# Patient Record
Sex: Male | Born: 1980 | Hispanic: Yes | Marital: Married | Smoking: Never smoker
Health system: Southern US, Community
[De-identification: ages and names within clinical notes are randomized; demographics above are authoritative.]

## PROBLEM LIST (undated history)

## (undated) DIAGNOSIS — K7460 Unspecified cirrhosis of liver: Secondary | ICD-10-CM

---

## 2011-10-01 ENCOUNTER — Ambulatory Visit: Payer: Self-pay | Admitting: Family Medicine

## 2011-10-01 VITALS — BP 125/76 | HR 83 | Temp 98.1°F | Resp 16 | Ht 65.0 in | Wt 207.8 lb

## 2011-10-01 DIAGNOSIS — B360 Pityriasis versicolor: Secondary | ICD-10-CM

## 2011-10-01 DIAGNOSIS — Z789 Other specified health status: Secondary | ICD-10-CM

## 2011-10-01 MED ORDER — KETOCONAZOLE 200 MG PO TABS: 400.0000 mg | ORAL_TABLET | Freq: Once | ORAL | Status: DC

## 2011-10-01 NOTE — Patient Instructions (Signed)
Tia versicolor (Tinea Versicolor) Le han diagnosticado que usted padece tia versicolor. Se trata de una infeccin de la piel bastante frecuente, provocada por hongos. Los hongos son habitantes normales de [place] piel, pero el trastorno comienza cuando se desarrollan en forma desmedida. Se presenta como manchas de color blanco o marrn claro en la piel marrn, que son ms evidentes en el verano, sobre la piel bronceada. Estas reas son ligeramente escamosas si se las rasca. Las manchas ms claras producidas por el hongo se hacen ms evidentes cuando ocasiona "agujeros en el bronceado". Se advierte con ms frecuencia en el verano. Las manchas generalmente estn ubicados en el trax, el pubis, el cuello y los pliegues corporales. Sin embargo, esto puede ocurrir en cualquier rea del cuerpo. Puede haber picazn e inflamacin (enrojecimiento e irritacin) leve. DIAGNSTICO Este diagnstico (conocer el problema) se realiza clnicamente (con el examen visual). En general no hace falta realizar cultivos para el diagnstico. El examen en el microscopio puede ser de [place]. Sin embargo, los hongos se Games developer sobre la piel, de modo que el diagnstico sigue siendo clnico. El examen bajo la luz ultravioleta de [place] puede determinar la extensin de la infeccin. TRATAMIENTO Esta infeccin frecuente generalmente trae slo una preocupacin cosmtica (relacionada con la apariencia). En general, puede tratarse fcilmente con champ anticaspa en el momento de la ducha o el bao. Un restregado vigoroso generalmente Scientific laboratory technician. Las zonas claras de la piel pueden Personal assistant as durante semanas o meses despus que la infeccion se haya curado, excepto que exponga la piel a la IT consultant. Las manchas ms claras o ms oscuras causadas por hongos, que permanecen luego de [employer], no son signo de fracaso del mismo; puede llevar un largo tiempo hasta que desaparezcan. El  profesional que lo asiste podr recomendarle cierto nmero de preparaciones comerciales o medicamentos por boca si el tratamiento casero no Retail buyer. Las recurrencias son frecuentes y podra ser necesario que tome medicamentos preventivos. Este trastorno de la piel no es demasiado contagioso y no requiere de pautas especficas para proteger a Theme park manager y [employer] de Market researcher. Generalmente lo ms adecuado es la higiene normal. Slo ser necesario hacer el seguimiento si presenta complicaciones como una infeccin secundaria debido al rascado, etc. o si el profesional que lo asiste se lo indica, o si no obtiene alivio de los [employer]. Document Released: 05/14/2005 Document Revised: 04/16/2011 [facility] Patient Information 2012 [place], [place].

## 2011-10-01 NOTE — Progress Notes (Signed)
  Subjective:    Patient ID: Mark Bush, male    DOB: August 04, 1981, 31 y.o.   MRN: [id number]  Rash This is a recurrent problem. The current episode started 1 to 4 weeks ago. The problem has been gradually worsening since onset. The affected locations include the torso, chest, abdomen and back. The rash is characterized by itchiness and redness. He was exposed to nothing. Pertinent negatives include no congestion, cough, fever, shortness of breath or sore throat. Past treatments include nothing.   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   Review of Systems  Constitutional: Negative for fever and chills.  HENT: Negative for congestion and sore throat.   Respiratory: Negative for cough and shortness of breath.   Skin: Positive for color change and rash. Negative for wound.       Objective:   Physical Exam  Constitutional: He appears well-developed and well-nourished.  HENT:  Head: Normocephalic and atraumatic.  Neck: Normal range of motion. Neck supple.  Cardiovascular: Normal rate, regular rhythm, normal heart sounds and intact distal pulses.   Pulmonary/Chest: Effort normal.  Abdominal: Soft.  Skin: Rash noted.          hyperpimented with slight scale confluent plaques upper back, chest, shoulders, with scattered plaques on mid chest and mid back.  No arm/leg rash noted.  Psychiatric: His behavior is normal.   Skin scraping koh positive.       Assessment & Plan:  [name] is a 31 y.o. male 1. Tinea versicolor  POCT Skin KOH  2. Language Barrier     Nizoral 400 mg, exercise to sweat, then repeat in 1 week.    Return to the clinic or go to the nearest emergency room if any of your symptoms worsen or new symptoms occur.

## 2012-05-20 ENCOUNTER — Emergency Department (HOSPITAL_COMMUNITY)
Admission: EM | Admit: 2012-05-20 | Discharge: 2012-05-20 | Disposition: A | Discharge status: Home / Self Care | Payer: Self-pay | Attending: Emergency Medicine | Admitting: Emergency Medicine

## 2012-05-20 ENCOUNTER — Encounter (HOSPITAL_COMMUNITY): Payer: Self-pay | Admitting: Family Medicine

## 2012-05-20 DIAGNOSIS — L255 Unspecified contact dermatitis due to plants, except food: Secondary | ICD-10-CM | POA: Insufficient documentation

## 2012-05-20 DIAGNOSIS — L237 Allergic contact dermatitis due to plants, except food: Secondary | ICD-10-CM

## 2012-05-20 MED ORDER — PREDNISONE 20 MG PO TABS
60.0000 mg | ORAL_TABLET | Freq: Once | ORAL | Status: AC
  Administered 2012-05-20: 60 mg via ORAL
  Filled 2012-05-20: qty 3

## 2012-05-20 MED ORDER — PREDNISONE 20 MG PO TABS: 60.0000 mg | ORAL_TABLET | Freq: Every day | ORAL | Status: DC

## 2012-05-20 NOTE — ED Notes (Signed)
Reports poison ivy to legs and back x 1 week. States applied medication no relief.

## 2012-05-20 NOTE — ED Provider Notes (Signed)
Medical screening examination/treatment/procedure(s) were performed by non-physician practitioner and as supervising physician I was immediately available for consultation/collaboration.  Synethia Endicott, MD 05/20/12 2359 

## 2012-05-20 NOTE — ED Provider Notes (Signed)
History     CSN: [id number]  Arrival date & time 05/20/12  1641   First MD Initiated Contact with Patient 05/20/12 1829      Chief Complaint  Patient presents with  . Poison Ivy    (Consider location/radiation/quality/duration/timing/severity/associated sxs/prior treatment) HPI Patient possess the emergency department for evaluation of rash to bilateral legs and another rash to his back. He says that he was outside in the yard and got his legs scratched by a plan. He states that since then it has been very itchy. This episode happened one week ago. There are blisters associated with the rash. He's been using calamine lotion and anti-itch cream. He states that he poison ivy before and this looks just like the same. He has not had any weakness, fevers, drainage from the area. Vital signs are stable he looks in no acute distress.   History reviewed. No pertinent past medical history.  History reviewed. No pertinent past surgical history.  History reviewed. No pertinent family history.  History  Substance Use Topics  . Smoking status: Never Smoker   . Smokeless tobacco: Not on file  . Alcohol Use: No      Review of Systems  Review of Systems  Gen: no weight loss, fevers, chills, night sweats  Eyes: no discharge or drainage, no occular pain or visual changes  Nose: no epistaxis or rhinorrhea  Mouth: no dental pain, no sore throat  Neck: no neck pain  Lungs:No wheezing, coughing or hemoptysis CV: no chest pain, palpitations, dependent edema or orthopnea  Abd: no abdominal pain, nausea, vomiting  GU: no dysuria or gross hematuria  MSK:  No abnormalities  Neuro: no headache, no focal neurologic deficits  Skin: rash to back and bilateral lower legs Psyche: negative.   Allergies  Review of patient's allergies indicates no known allergies.  Home Medications   Current Outpatient Rx  Name Route Sig Dispense Refill  . CALAMINE EX LOTN Topical Apply 1 application topically  as needed. Poison ivy    . CALAGEL MAXIMUM STRENGTH EX GEL Apply externally Apply 1 application topically as needed. Poison ivy    . PREDNISONE 20 MG PO TABS Oral Take 3 tablets (60 mg total) by mouth daily. 15 tablet 0    BP 126/69  Pulse 69  Temp 98.4 F (36.9 C) (Oral)  Resp 18  SpO2 98%  Physical Exam  Nursing note and vitals reviewed. Constitutional: He appears well-developed and well-nourished. No distress.  HENT:  Head: Normocephalic and atraumatic.  Eyes: Pupils are equal, round, and reactive to light.  Neck: Normal range of motion. Neck supple.  Cardiovascular: Normal rate and regular rhythm.   Pulmonary/Chest: Effort normal.  Abdominal: Soft.  Neurological: He is alert.  Skin: Skin is warm and dry. Rash noted. Rash is vesicular and urticarial.       ED Course  Procedures (including critical care time)  Labs Reviewed - No data to display No results found.   1. Poison ivy dermatitis       MDM   Urticaria covers patient back and rash also covering bilateral lower legs. Non-indurated no crepitus no pustules. No symptoms or physical exam findings to suggest infection. I've given the patient 60 mg of prednisone in the emergency department and prescribed a procedure for oral prednisone as well. He denies being a diabetic. Patient given referral to dermatology and told to return back to the ER if and once today for rash is not getting better and or if it  is getting worse.  Pt has been advised of the symptoms that warrant their return to the ED. Patient has voiced understanding and has agreed to follow-up with the PCP or specialist.        Dorthula Matas, PA 05/20/12 [phone]

## 2013-12-20 ENCOUNTER — Observation Stay (HOSPITAL_COMMUNITY): Payer: Self-pay

## 2013-12-20 ENCOUNTER — Encounter (HOSPITAL_COMMUNITY): Payer: Self-pay | Admitting: Emergency Medicine

## 2013-12-20 ENCOUNTER — Inpatient Hospital Stay (HOSPITAL_COMMUNITY)
Admission: EM | Admit: 2013-12-20 | Discharge: 2013-12-22 | DRG: 897 | Disposition: A | Discharge status: Home / Self Care | Payer: Self-pay | Attending: Internal Medicine | Admitting: Internal Medicine

## 2013-12-20 DIAGNOSIS — D6959 Other secondary thrombocytopenia: Secondary | ICD-10-CM | POA: Diagnosis present

## 2013-12-20 DIAGNOSIS — F10239 Alcohol dependence with withdrawal, unspecified: Principal | ICD-10-CM | POA: Diagnosis present

## 2013-12-20 DIAGNOSIS — R945 Abnormal results of liver function studies: Secondary | ICD-10-CM

## 2013-12-20 DIAGNOSIS — F1020 Alcohol dependence, uncomplicated: Secondary | ICD-10-CM | POA: Diagnosis present

## 2013-12-20 DIAGNOSIS — F10939 Alcohol use, unspecified with withdrawal, unspecified: Principal | ICD-10-CM | POA: Diagnosis present

## 2013-12-20 DIAGNOSIS — R7989 Other specified abnormal findings of blood chemistry: Secondary | ICD-10-CM

## 2013-12-20 DIAGNOSIS — K700 Alcoholic fatty liver: Secondary | ICD-10-CM | POA: Diagnosis present

## 2013-12-20 DIAGNOSIS — R259 Unspecified abnormal involuntary movements: Secondary | ICD-10-CM | POA: Diagnosis present

## 2013-12-20 DIAGNOSIS — F1410 Cocaine abuse, uncomplicated: Secondary | ICD-10-CM | POA: Diagnosis present

## 2013-12-20 LAB — CBC WITH DIFFERENTIAL/PLATELET
BASOS PCT: 0 % (ref 0–1)
Basophils Absolute: 0 10*3/uL (ref 0.0–0.1)
Eosinophils Absolute: 0 10*3/uL (ref 0.0–0.7)
Eosinophils Relative: 0 % (ref 0–5)
HEMATOCRIT: 45.5 % (ref 39.0–52.0)
HEMOGLOBIN: 16.6 g/dL (ref 13.0–17.0)
LYMPHS ABS: 1 10*3/uL (ref 0.7–4.0)
Lymphocytes Relative: 20 % (ref 12–46)
MCH: 31.1 pg (ref 26.0–34.0)
MCHC: 36.5 g/dL — AB (ref 30.0–36.0)
MCV: 85.2 fL (ref 78.0–100.0)
MONOS PCT: 11 % (ref 3–12)
Monocytes Absolute: 0.5 10*3/uL (ref 0.1–1.0)
NEUTROS PCT: 69 % (ref 43–77)
Neutro Abs: 3.5 10*3/uL (ref 1.7–7.7)
Platelets: 212 10*3/uL (ref 150–400)
RBC: 5.34 MIL/uL (ref 4.22–5.81)
RDW: 12.8 % (ref 11.5–15.5)
WBC: 5.1 10*3/uL (ref 4.0–10.5)

## 2013-12-20 LAB — COMPREHENSIVE METABOLIC PANEL
ALK PHOS: 88 U/L (ref 39–117)
ALT: 68 U/L — ABNORMAL HIGH (ref 0–53)
AST: 84 U/L — ABNORMAL HIGH (ref 0–37)
Albumin: 4.7 g/dL (ref 3.5–5.2)
BUN: 5 mg/dL — AB (ref 6–23)
CO2: 22 mEq/L (ref 19–32)
CREATININE: 0.63 mg/dL (ref 0.50–1.35)
Calcium: 9.6 mg/dL (ref 8.4–10.5)
Chloride: 98 mEq/L (ref 96–112)
GFR calc non Af Amer: >90 mL/min (ref >90)
GLUCOSE: 85 mg/dL (ref 70–99)
POTASSIUM: 3.9 meq/L (ref 3.7–5.3)
Sodium: 139 mEq/L (ref 137–147)
TOTAL PROTEIN: 8.5 g/dL — AB (ref 6.0–8.3)
Total Bilirubin: 1 mg/dL (ref 0.3–1.2)

## 2013-12-20 LAB — URINALYSIS, ROUTINE W REFLEX MICROSCOPIC
Bilirubin Urine: NEGATIVE
Glucose, UA: NEGATIVE mg/dL
HGB URINE DIPSTICK: NEGATIVE
KETONES UR: NEGATIVE mg/dL
Leukocytes, UA: NEGATIVE
Nitrite: NEGATIVE
PROTEIN: NEGATIVE mg/dL
Specific Gravity, Urine: 1.009 (ref 1.005–1.030)
UROBILINOGEN UA: 0.2 mg/dL (ref 0.0–1.0)
pH: 6.5 (ref 5.0–8.0)

## 2013-12-20 LAB — CBC
HCT: 44.6 % (ref 39.0–52.0)
Hemoglobin: 16 g/dL (ref 13.0–17.0)
MCH: 30.8 pg (ref 26.0–34.0)
MCHC: 35.9 g/dL (ref 30.0–36.0)
MCV: 85.9 fL (ref 78.0–100.0)
Platelets: 212 10*3/uL (ref 150–400)
RBC: 5.19 MIL/uL (ref 4.22–5.81)
RDW: 12.8 % (ref 11.5–15.5)
WBC: 4.3 10*3/uL (ref 4.0–10.5)

## 2013-12-20 LAB — RAPID URINE DRUG SCREEN, HOSP PERFORMED
AMPHETAMINES: NOT DETECTED
BARBITURATES: NOT DETECTED
Benzodiazepines: NOT DETECTED
Cocaine: POSITIVE — AB
Opiates: NOT DETECTED
TETRAHYDROCANNABINOL: NOT DETECTED

## 2013-12-20 LAB — CREATININE, SERUM: CREATININE: 0.76 mg/dL (ref 0.50–1.35)

## 2013-12-20 LAB — AMMONIA: Ammonia: 38 umol/L (ref 11–60)

## 2013-12-20 LAB — I-STAT TROPONIN, ED: TROPONIN I, POC: 0 ng/mL (ref 0.00–0.08)

## 2013-12-20 LAB — ETHANOL: ALCOHOL ETHYL (B): 16 mg/dL — AB (ref 0–11)

## 2013-12-20 LAB — LIPASE, BLOOD: LIPASE: 53 U/L (ref 11–59)

## 2013-12-20 MED ORDER — LORAZEPAM 1 MG PO TABS
0.0000 mg | ORAL_TABLET | Freq: Four times a day (QID) | ORAL | Status: DC
  Administered 2013-12-20: 2 mg via ORAL
  Administered 2013-12-21: 1 mg via ORAL
  Administered 2013-12-22: 2 mg via ORAL
  Filled 2013-12-20: qty 1

## 2013-12-20 MED ORDER — ONDANSETRON 4 MG PO TBDP
8.0000 mg | ORAL_TABLET | Freq: Once | ORAL | Status: AC
  Administered 2013-12-20: 8 mg via ORAL
  Filled 2013-12-20: qty 2

## 2013-12-20 MED ORDER — ADULT MULTIVITAMIN W/MINERALS CH
1.0000 | ORAL_TABLET | Freq: Every day | ORAL | Status: DC
  Administered 2013-12-20 – 2013-12-22 (×3): 1 via ORAL
  Filled 2013-12-20 (×3): qty 1

## 2013-12-20 MED ORDER — THIAMINE HCL 100 MG/ML IJ SOLN: 100.0000 mg | Freq: Every day | INTRAMUSCULAR | Status: DC

## 2013-12-20 MED ORDER — ONDANSETRON HCL 4 MG PO TABS: 4.0000 mg | ORAL_TABLET | Freq: Four times a day (QID) | ORAL | Status: DC | PRN

## 2013-12-20 MED ORDER — THIAMINE HCL 100 MG/ML IJ SOLN
Freq: Once | INTRAVENOUS | Status: AC
  Administered 2013-12-20: 23:00:00 via INTRAVENOUS
  Filled 2013-12-20: qty 1000

## 2013-12-20 MED ORDER — SODIUM CHLORIDE 0.9 % IV SOLN
INTRAVENOUS | Status: DC
  Administered 2013-12-21 – 2013-12-22 (×2): via INTRAVENOUS

## 2013-12-20 MED ORDER — IOHEXOL 300 MG/ML  SOLN
100.0000 mL | Freq: Once | INTRAMUSCULAR | Status: AC | PRN
  Administered 2013-12-20: 100 mL via INTRAVENOUS

## 2013-12-20 MED ORDER — VITAMIN B-1 100 MG PO TABS
100.0000 mg | ORAL_TABLET | Freq: Every day | ORAL | Status: DC
  Administered 2013-12-20 – 2013-12-22 (×3): 100 mg via ORAL
  Filled 2013-12-20 (×3): qty 1

## 2013-12-20 MED ORDER — LORAZEPAM 1 MG PO TABS
0.0000 mg | ORAL_TABLET | Freq: Two times a day (BID) | ORAL | Status: DC
  Filled 2013-12-20 (×2): qty 2

## 2013-12-20 MED ORDER — FOLIC ACID 1 MG PO TABS
1.0000 mg | ORAL_TABLET | Freq: Every day | ORAL | Status: DC
  Administered 2013-12-20 – 2013-12-22 (×3): 1 mg via ORAL
  Filled 2013-12-20 (×3): qty 1

## 2013-12-20 MED ORDER — HEPARIN SODIUM (PORCINE) 5000 UNIT/ML IJ SOLN
5000.0000 [IU] | Freq: Three times a day (TID) | INTRAMUSCULAR | Status: DC
  Administered 2013-12-20 – 2013-12-22 (×5): 5000 [IU] via SUBCUTANEOUS
  Filled 2013-12-20 (×8): qty 1

## 2013-12-20 MED ORDER — PANTOPRAZOLE SODIUM 40 MG PO TBEC
40.0000 mg | DELAYED_RELEASE_TABLET | Freq: Every day | ORAL | Status: DC
  Administered 2013-12-20 – 2013-12-21 (×2): 40 mg via ORAL
  Filled 2013-12-20 (×4): qty 1

## 2013-12-20 MED ORDER — LORAZEPAM 1 MG PO TABS
1.0000 mg | ORAL_TABLET | Freq: Once | ORAL | Status: AC
  Administered 2013-12-20: 1 mg via ORAL
  Filled 2013-12-20: qty 1

## 2013-12-20 MED ORDER — ONDANSETRON HCL 4 MG/2ML IJ SOLN: 4.0000 mg | Freq: Four times a day (QID) | INTRAMUSCULAR | Status: DC | PRN

## 2013-12-20 NOTE — ED Notes (Signed)
Pt presents to ED with lower back pain, headache, vomiting after eating, hot and cold flashes, and bilateral arm numbness x3 days. Pt denies any injury. Pt denies chest pain, difficulty with urinating. Pt diaphoretic in triage

## 2013-12-20 NOTE — ED Notes (Signed)
Central lower back pain X 4 days, pain causing nausea and vomiting. Pt has been drinking alcohol every day since Saturday, about a 24 pack per day, for 8 years. Has not been eating well -patient states. Translator used.

## 2013-12-20 NOTE — H&P (Addendum)
Hospitalist Admission History and Physical  Patient name: Mark Bush Medical record number: [id number] Date of birth: 07/15/1981 Age: 33 y.o. Gender: male  Primary Care Provider: No PCP Per Patient  Chief Complaint: ETOH withdrawal   History of Present Illness:This is a 33 y.o. year old male with prior hx/o alcohol abuse presenting with ETOH withdrawal. Patient's reported surgery to 24 pack daily. Has been doing this for over 8 years. Patient said he wanted to quit drinking 3 days ago. Has a severe nausea, malaise, tremor since this point. Presents to the ER with worsening symptoms. Hemodynamically stable on presentation. Afebrile. No white count. Patient denies any hematemesis, hematochezia, coffee-ground emesis, black stools. Has had some mild right lower quadrant abdominal pain over this time frame. Lipase 53. AST/ALT 84/68. Cocaine + on UDS. ETOH level 16. ER physician concerned about withdrawals/seizures at home. No prior hx/o seizures in the past.   Patient Active Problem List   Diagnosis Date Noted  . Alcohol withdrawal 12/20/2013   Past Medical History: History reviewed. No pertinent past medical history.  Past Surgical History: History reviewed. No pertinent past surgical history.  Social History: History   Social History  . Marital Status: Married    Spouse Name: N/A    Number of Children: N/A  . Years of Education: N/A   Social History Main Topics  . Smoking status: Never Smoker   . Smokeless tobacco: None  . Alcohol Use: No  . Drug Use: No  . Sexual Activity:    Other Topics Concern  . None   Social History Narrative  . None    Family History: History reviewed. No pertinent family history.  Allergies: No Known Allergies  Current Facility-Administered Medications  Medication Dose Route Frequency Provider Last Rate Last Dose  . 0.9 %  sodium chloride infusion   Intravenous Continuous Doree AlbeeSteven Newton, MD      . folic acid (FOLVITE) tablet 1 mg  1 mg  Oral Daily Doree AlbeeSteven Newton, MD      . heparin injection 5,000 Units  5,000 Units Subcutaneous 3 times per day Doree AlbeeSteven Newton, MD      . LORazepam (ATIVAN) tablet 0-4 mg  0-4 mg Oral 4 times per day Garlon HatchetLisa M Sanders, PA-C   2 mg at 12/20/13 2031  . LORazepam (ATIVAN) tablet 0-4 mg  0-4 mg Oral Q12H Garlon HatchetLisa M Sanders, PA-C      . multivitamin with minerals tablet 1 tablet  1 tablet Oral Daily Doree AlbeeSteven Newton, MD      . ondansetron [facility](ZOFRAN) tablet 4 mg  4 mg Oral Q6H PRN Doree AlbeeSteven Newton, MD       Or  . ondansetron [facility](ZOFRAN) injection 4 mg  4 mg Intravenous Q6H PRN Doree AlbeeSteven Newton, MD      . pantoprazole (PROTONIX) EC tablet 40 mg  40 mg Oral Daily Doree AlbeeSteven Newton, MD      . sodium chloride 0.9 % 1,000 mL with thiamine 100 mg, folic acid 1 mg, multivitamins adult 10 mL infusion   Intravenous Once Doree AlbeeSteven Newton, MD      . thiamine (VITAMIN B-1) tablet 100 mg  100 mg Oral Daily Garlon HatchetLisa M Sanders, PA-C   100 mg at 12/20/13 1741   No current outpatient prescriptions on file.   Review Of Systems: 12 point ROS negative except as noted above in HPI.  Physical Exam: Filed Vitals:   12/20/13 2100  BP: 137/79  Pulse:   Temp:   Resp: 18    General: cooperative and  minimal distress HEENT: PERRLA and extra ocular movement intact Heart: S1, S2 normal, no murmur, rub or gallop, regular rate and rhythm Lungs: clear to auscultation, no wheezes or rales and unlabored breathing Abdomen: + bowel sounds, + RLQ TTP mild,  Extremities: extremities normal, atraumatic, no cyanosis or edema Skin:no rashes, no ecchymoses Neurology: normal without focal findings and no asterixis, minimal and tremors   Labs and Imaging: Lab Results  Component Value Date/Time   NA 139 12/20/2013  2:45 PM   K 3.9 12/20/2013  2:45 PM   CL 98 12/20/2013  2:45 PM   CO2 22 12/20/2013  2:45 PM   BUN 5* 12/20/2013  2:45 PM   CREATININE 0.63 12/20/2013  2:45 PM   GLUCOSE 85 12/20/2013  2:45 PM   Lab Results  Component Value Date   WBC 5.1 12/20/2013   HGB 16.6  12/20/2013   HCT 45.5 12/20/2013   MCV 85.2 12/20/2013   PLT 212 12/20/2013   No results found.   Assessment and Plan: Mark Bush is a 33 y.o. year old male presenting with ETOH withdrawal   ETOH withdrawal: Will place on CIWA protocol. NS. Banana bag. Check ammonia level. Trend LFTs. Fairly mild transaminitis. Also check hepatitis panel. Pt declining inpt rehab.   Abd pain: lipase WNL. Check CT Abd and Pelvis to r/o appendicitis other intra-abdominal pathology.   FEN/GI: Banana bag. Heart healthy diet. Zofran. PPI.  Prophylaxis: sub q heparin  Disposition: pending further evaluation  Code Status:Full Code         Doree AlbeeSteven Newton MD  Pager: [phone]

## 2013-12-20 NOTE — ED Provider Notes (Signed)
CSN: [id number]     Arrival date & time 12/20/13  1433 History   First MD Initiated Contact with Patient 12/20/13 1622     Chief Complaint  Patient presents with  . Headache  . Back Pain  . Arm Pain  . Emesis     (Consider location/radiation/quality/duration/timing/severity/associated sxs/prior Treatment) Patient is a 33 y.o. male presenting with headaches, back pain, arm pain, and vomiting. The history is provided by the patient and medical records.  Headache Associated symptoms: back pain, myalgias, nausea and vomiting   Back Pain Associated symptoms: headaches   Arm Pain Associated symptoms include arthralgias, diaphoresis, headaches, myalgias, nausea and vomiting.  Emesis Associated symptoms: arthralgias, headaches and myalgias    This is a 33 year old male with no significant past medical history presenting to the ED with multiple complaints. He complains of headache localized to the occipital region, low back pain, bilateral arm pain, bilateral leg pain, nausea, vomiting, sweats and chills for the past 2 days.  Patient has been a daily drinker for the past 8 years.  States he drinks anywhere from 24-30 beers throughout the course of the day, often times skipping meals and only drinking alcohol.  Patient states on Sunday he decided he wanted to stop drinking because it was interfering with his work.  States he has never attempted to detox before.  Per significant other at bedside, sx have been progressively worsening since Sunday and today he developed tremors which prompted him to seek medical attention.  No seizure activity, falls, or head trauma.  States he has been trying to eat and drink fluids but he continues vomiting.  Pt denies illicit drug use.  Pt states he does want to stop drinking but does not want to go to a rehab facility.  History reviewed. No pertinent past medical history. History reviewed. No pertinent past surgical history. History reviewed. No pertinent family  history. History  Substance Use Topics  . Smoking status: Never Smoker   . Smokeless tobacco: Not on file  . Alcohol Use: No    Review of Systems  Constitutional: Positive for diaphoresis.  Gastrointestinal: Positive for nausea and vomiting.  Musculoskeletal: Positive for arthralgias, back pain and myalgias.  Neurological: Positive for tremors and headaches.  All other systems reviewed and are negative.     Allergies  Review of patient's allergies indicates no known allergies.  Home Medications   Prior to Admission medications   Not on File   BP 147/80  Pulse 83  Temp(Src) 99.1 F (37.3 C) (Oral)  Resp 16  Ht 5\' 8"  (1.727 m)  Wt 215 lb 6.4 oz (97.705 kg)  BMI 32.76 kg/m2  SpO2 100%  Physical Exam  Nursing note and vitals reviewed. Constitutional: He is oriented to person, place, and time. He appears well-developed and well-nourished.  Diaphoretic, mildly pale, appears uncomfortable  HENT:  Head: Normocephalic and atraumatic.  Mouth/Throat: Oropharynx is clear and moist.  Eyes: Conjunctivae and EOM are normal. Pupils are equal, round, and reactive to light.  Neck: Normal range of motion. Neck supple.  Cardiovascular: Normal rate, regular rhythm and normal heart sounds.   Pulmonary/Chest: Effort normal and breath sounds normal. No respiratory distress. He has no wheezes.  Abdominal: Soft. Bowel sounds are normal. There is no tenderness. There is no guarding, no CVA tenderness, no tenderness at McBurney's point and negative Murphy's sign.  Abdomen soft, nondistended, no peritoneal signs  Musculoskeletal: Normal range of motion. He exhibits no edema.  Neurological: He is alert and  oriented to person, place, and time. He displays tremor.  AAOx3, answering questions and following commands appropriately; equal strength UE and LE bilaterally; CN grossly intact; moves all extremities with purposeful movements; tremors of bilateral hands noted; no seizure activity  Skin:  Skin is warm. He is diaphoretic.  Psychiatric: He has a normal mood and affect.    ED Course  Procedures (including critical care time) Labs Review Labs Reviewed  CBC WITH DIFFERENTIAL - Abnormal; Notable for the following:    MCHC 36.5 (*)    All other components within normal limits  COMPREHENSIVE METABOLIC PANEL - Abnormal; Notable for the following:    BUN 5 (*)    Total Protein 8.5 (*)    AST 84 (*)    ALT 68 (*)    All other components within normal limits  URINE RAPID DRUG SCREEN (HOSP PERFORMED) - Abnormal; Notable for the following:    Cocaine POSITIVE (*)    All other components within normal limits  ETHANOL - Abnormal; Notable for the following:    Alcohol, Ethyl (B) 16 (*)    All other components within normal limits  LIPASE, BLOOD  URINALYSIS, ROUTINE W REFLEX MICROSCOPIC  CBC  CREATININE, SERUM  COMPREHENSIVE METABOLIC PANEL  CBC WITH DIFFERENTIAL  AMMONIA  I-STAT TROPOININ, ED    Imaging Review No results found.   EKG Interpretation None      MDM   Final diagnoses:  Alcohol withdrawal   33 year old male with a year history of chronic alcohol abuse for the past 8 years, last drink 2 days ago.  On exam, pt is diaphoretic, tremulous, and appears to be in acute EtOH withdrawal.  Abdominal exam is benign without peritoneal signs.  Neuro exam non-focal.  Pt given dose of zofran, ativan, and thiamine.  Likely will need hospital admission unless significant improvement.  Labs as above-- no significant electrolyte imbalance. Ethanol 16, UDS positive for cocaine.  After meds, patient's condition is not much improved. He continues to be tremulous, is still nauseated but no further vomiting. Concern for worsening condition and development of DTs.  Feel the patient would benefit from observation. He has been placed on CIWA protocol.  Case discussed with hospitalist, Dr. Alvester MorinNewton who will admit.  Garlon HatchetLisa M Amorita Vanrossum, PA-C 12/20/13 2154

## 2013-12-20 NOTE — ED Notes (Signed)
Pt reports drinking daily x1 week but quit drinking 2 days ago, pt states "I just didn't want anything to drink." Pt has obvious tremors, redness to his face, and diaphoretic

## 2013-12-21 DIAGNOSIS — F10239 Alcohol dependence with withdrawal, unspecified: Principal | ICD-10-CM

## 2013-12-21 DIAGNOSIS — F10939 Alcohol use, unspecified with withdrawal, unspecified: Principal | ICD-10-CM

## 2013-12-21 LAB — COMPREHENSIVE METABOLIC PANEL
ALBUMIN: 3.7 g/dL (ref 3.5–5.2)
ALT: 51 U/L (ref 0–53)
AST: 56 U/L — ABNORMAL HIGH (ref 0–37)
Alkaline Phosphatase: 68 U/L (ref 39–117)
BUN: 9 mg/dL (ref 6–23)
CO2: 24 mEq/L (ref 19–32)
CREATININE: 0.75 mg/dL (ref 0.50–1.35)
Calcium: 8.8 mg/dL (ref 8.4–10.5)
Chloride: 100 mEq/L (ref 96–112)
GFR calc Af Amer: >90 mL/min (ref >90)
Glucose, Bld: 78 mg/dL (ref 70–99)
Potassium: 3.6 mEq/L — ABNORMAL LOW (ref 3.7–5.3)
Sodium: 140 mEq/L (ref 137–147)
TOTAL PROTEIN: 6.7 g/dL (ref 6.0–8.3)
Total Bilirubin: 1.2 mg/dL (ref 0.3–1.2)

## 2013-12-21 LAB — CBC WITH DIFFERENTIAL/PLATELET
Basophils Absolute: 0 10*3/uL (ref 0.0–0.1)
Basophils Relative: 0 % (ref 0–1)
EOS ABS: 0.1 10*3/uL (ref 0.0–0.7)
Eosinophils Relative: 1 % (ref 0–5)
HEMATOCRIT: 42 % (ref 39.0–52.0)
HEMOGLOBIN: 14.5 g/dL (ref 13.0–17.0)
LYMPHS ABS: 1.2 10*3/uL (ref 0.7–4.0)
Lymphocytes Relative: 22 % (ref 12–46)
MCH: 30.1 pg (ref 26.0–34.0)
MCHC: 34.5 g/dL (ref 30.0–36.0)
MCV: 87.1 fL (ref 78.0–100.0)
MONO ABS: 0.4 10*3/uL (ref 0.1–1.0)
MONOS PCT: 8 % (ref 3–12)
NEUTROS PCT: 69 % (ref 43–77)
Neutro Abs: 3.7 10*3/uL (ref 1.7–7.7)
Platelets: 182 10*3/uL (ref 150–400)
RBC: 4.82 MIL/uL (ref 4.22–5.81)
RDW: 13 % (ref 11.5–15.5)
WBC: 5.4 10*3/uL (ref 4.0–10.5)

## 2013-12-21 LAB — HEPATITIS PANEL, ACUTE
HCV AB: NEGATIVE
HEP B C IGM: NONREACTIVE
HEP B S AG: NEGATIVE
Hep A IgM: NONREACTIVE

## 2013-12-21 NOTE — Progress Notes (Signed)
UR completed. Patient changed to inpatient- requiring IVF and on CIWA protocol

## 2013-12-21 NOTE — Progress Notes (Signed)
NURSING PROGRESS NOTE  Mark Bush [id number] Admission Data: 12/21/2013 4:10 AM Attending Provider: Doree AlbeeSteven Newton, MD PCP:No PCP Per Patient Code Status:full code  Mark Bush is a 33 y.o. male patient admitted from ED:  -No acute distress noted.  -No complaints of shortness of breath.  -No complaints of chest pain.   Cardiac Monitoring: Box ;NA  in place. Cardiac monitor yields:N/A  Blood pressure 135/79, pulse 98, temperature 97.3 F (36.3 C), temperature source Oral, resp. rate 18, height 5\' 11"  (1.803 m), weight 95.482 kg (210 lb 8 oz), SpO2 98.00%.   IV Fluids:  IV in place, occlusive dsg intact without redness, IV cath forearm right, condition patent and no redness normal saline.   Allergies:  Review of patient's allergies indicates no known allergies.  Past Medical History:   has no past medical history on file.  Past Surgical History:   has no past surgical history on file.  Social History:   reports that he has never smoked. He does not have any smokeless tobacco history on file. He reports that he drinks about 14.4 ounces of alcohol per week. He reports that he does not use illicit drugs.  Skin: NSI  Patient/Family orientated to room. Information packet given to patient/family. Admission inpatient armband information verified with patient/family to include name and date of birth and placed on patient arm. Side rails up x 2, fall assessment and education completed with patient/family. Patient/family able to verbalize understanding of risk associated with falls and verbalized understanding to call for assistance before getting out of bed. Call light within reach. Patient/family able to voice and demonstrate understanding of unit orientation instructions.    Will continue to evaluate and treat per MD orders.

## 2013-12-21 NOTE — Clinical Social Work Note (Signed)
CSW unable to complete full substance abuse assessment for patient prior to discharge. CSW provided patient with AA meeting schedule and list of treatment facilities.   Roddie McBryant Linlee Cromie MSW, [place]CSWA, [place], [phone]

## 2013-12-21 NOTE — Progress Notes (Signed)
PROGRESS NOTE  Mark Bush RUE:[id number] DOB: 03/12/1981 DOA: 12/20/2013 PCP: No PCP Per Patient  This is a 33 y.o. year old male with prior hx/o alcohol abuse presenting with ETOH withdrawal. Patient's wife reports he drinks a  24 pack daily. Has been doing this for over 8 years. Patient states he quit drinking 3 days ago. Has a severe nausea, malaise, tremor since this point. Presents to the ER with worsening symptoms. Has had some mild right lower quadrant abdominal pain over this time frame. Lipase 53. AST/ALT 84/68. Cocaine + on UDS. ETOH level 16. ER physician concerned about withdrawals/seizures at home.   Assessment/Plan:  ETOH withdrawl Patient questioned with RN and Translator.   Orientated to person and place, but thought was October.  Feels slightly shaky.  No other complaints. Patient on CIWA protocol.  Received banana bag.   Elevated AST/ALT Secondary to chronic alcohol abuse. Transaminases elevated in alcoholic hepatitis pattern. Acute hep panel pending. CT showed no acute intra abdominal abnormality.  Diffuse fatty liver  Poly substance abuse Cocaine positive.   Patient states it was a one time use. Social work has provided resources to assist with abstinence.   DVT Prophylaxis:  None.  Patient is young and mobile.  Unsteady on his feet so SCDs were not placed.  Code Status: full Family Communication: wife at bedside Disposition Plan: home 5/7.   Consultants:  none  Procedures:  none  Antibiotics: Anti-infectives   None      none  HPI/Subjective: No complaints.  Feels slightly unsteady on his feet.  States he is committed to not drink alcohol  Objective: Filed Vitals:   12/20/13 2115 12/20/13 2200 12/21/13 0002 12/21/13 0600  BP: 143/83 132/85 135/79 130/82  Pulse:  94 98 80  Temp:  99.1 F (37.3 C) 97.3 F (36.3 C) 97.3 F (36.3 C)  TempSrc:  Oral Oral Oral  Resp: 19 18 18 18   Height:  5\' 11"  (1.803 m)    Weight:  95.482 kg  (210 lb 8 oz)    SpO2:  97% 98% 98%    Intake/Output Summary (Last 24 hours) at 12/21/13 1219 Last data filed at 12/21/13 0719  Gross per 24 hour  Intake   1045 ml  Output    300 ml  Net    745 ml   Filed Weights   12/20/13 1441 12/20/13 2200  Weight: 97.705 kg (215 lb 6.4 oz) 95.482 kg (210 lb 8 oz)    Exam: General: Well developed, well nourished, NAD, appears stated age  HEENT:   Anicteic Sclera, MMM. No pharyngeal erythema or exudates  Neck: Supple, no JVD, no masses  Cardiovascular: RRR, S1 S2 auscultated, no rubs, murmurs or gallops.   Respiratory: Clear to auscultation bilaterally with equal chest rise  Abdomen: Soft, nontender, nondistended, + bowel sounds  Extremities: warm dry without cyanosis clubbing or edema.  Neuro: AAOx3, cranial nerves grossly intact. Strength 5/5 in upper and lower extremities  Skin: Without rashes exudates or nodules.   Psych: Normal affect and demeanor with intact judgement and insight   Data Reviewed: Basic Metabolic Panel:  Recent Labs Lab 12/20/13 1445 12/20/13 2135 12/21/13 0405  NA 139  --  140  K 3.9  --  3.6*  CL 98  --  100  CO2 22  --  24  GLUCOSE 85  --  78  BUN 5*  --  9  CREATININE 0.63 0.76 0.75  CALCIUM 9.6  --  8.8  Liver Function Tests:  Recent Labs Lab 12/20/13 1445 12/21/13 0405  AST 84* 56*  ALT 68* 51  ALKPHOS 88 68  BILITOT 1.0 1.2  PROT 8.5* 6.7  ALBUMIN 4.7 3.7    Recent Labs Lab 12/20/13 1445  LIPASE 53    Recent Labs Lab 12/20/13 2135  AMMONIA 38   CBC:  Recent Labs Lab 12/20/13 1445 12/20/13 2135 12/21/13 0405  WBC 5.1 4.3 5.4  NEUTROABS 3.5  --  3.7  HGB 16.6 16.0 14.5  HCT 45.5 44.6 42.0  MCV 85.2 85.9 87.1  PLT 212 212 182     Studies: Ct Abdomen Pelvis W Contrast  12/20/2013   CLINICAL DATA:  Right lower quadrant pain with non  EXAM: CT ABDOMEN AND PELVIS WITH CONTRAST  TECHNIQUE: Multidetector CT imaging of the abdomen and pelvis was performed using the standard  protocol following bolus administration of intravenous contrast.  CONTRAST:  100mL OMNIPAQUE IOHEXOL 300 MG/ML  SOLN  COMPARISON:  None.  FINDINGS: BODY WALL: Unremarkable.  LOWER CHEST: Unremarkable.  ABDOMEN/PELVIS:  Liver: Diffuse fatty infiltration.  No mass lesion.  Biliary: No evidence of biliary obstruction or stone.  Pancreas: Unremarkable.  Spleen: Unremarkable.  Adrenals: Unremarkable.  Kidneys and ureters: No hydronephrosis or stone.  Bladder: Symmetric, smooth apical bladder wall thickening is likely from underdistention given the normal urinalysis.  Reproductive: Unremarkable.  Bowel: No obstruction. Normal appendix.  Retroperitoneum: No mass or adenopathy.  Peritoneum: No free fluid or gas.  Vascular: No acute abnormality.  OSSEOUS: No acute abnormalities.  IMPRESSION: 1. No acute intra-abdominal abnormality.  Normal appendix. 2. Fatty liver.   Electronically Signed   By: Tiburcio PeaJonathan  Watts M.D.   On: 12/20/2013 23:45    Scheduled Meds: . folic acid  1 mg Oral Daily  . heparin  5,000 Units Subcutaneous 3 times per day  . LORazepam  0-4 mg Oral 4 times per day  . LORazepam  0-4 mg Oral Q12H  . multivitamin with minerals  1 tablet Oral Daily  . pantoprazole  40 mg Oral Daily  . thiamine  100 mg Oral Daily   Continuous Infusions: . sodium chloride 100 mL/hr at 12/21/13 [id number]    Active Problems:   Alcohol withdrawal    Stephani PoliceMarianne L York  [facility] Pager [phone]. If 7PM-7AM, please contact night-coverage at [email or web address], password [email or web address] 12/21/2013, 12:19 PM  LOS: 1 day    Attending Patient seen and examined,agree with the assessment and plan. Mildly tremulous, -but awake and mostly alert.Continue with Ativan per CIWA protocol. Home in the next 1-2 days.  Windell NorfolkS Lester Crickenberger MD

## 2013-12-22 DIAGNOSIS — R945 Abnormal results of liver function studies: Secondary | ICD-10-CM

## 2013-12-22 DIAGNOSIS — K700 Alcoholic fatty liver: Secondary | ICD-10-CM

## 2013-12-22 DIAGNOSIS — R7989 Other specified abnormal findings of blood chemistry: Secondary | ICD-10-CM | POA: Diagnosis present

## 2013-12-22 LAB — COMPREHENSIVE METABOLIC PANEL
ALT: 62 U/L — AB (ref 0–53)
AST: 63 U/L — AB (ref 0–37)
Albumin: 3.5 g/dL (ref 3.5–5.2)
Alkaline Phosphatase: 67 U/L (ref 39–117)
BUN: 6 mg/dL (ref 6–23)
CALCIUM: 8.8 mg/dL (ref 8.4–10.5)
CO2: 23 meq/L (ref 19–32)
Chloride: 104 mEq/L (ref 96–112)
Creatinine, Ser: 0.64 mg/dL (ref 0.50–1.35)
GFR calc Af Amer: >90 mL/min (ref >90)
GFR calc non Af Amer: >90 mL/min (ref >90)
Glucose, Bld: 101 mg/dL — ABNORMAL HIGH (ref 70–99)
Potassium: 3.7 mEq/L (ref 3.7–5.3)
Sodium: 140 mEq/L (ref 137–147)
TOTAL PROTEIN: 6.7 g/dL (ref 6.0–8.3)
Total Bilirubin: 0.7 mg/dL (ref 0.3–1.2)

## 2013-12-22 LAB — MAGNESIUM: Magnesium: 2.1 mg/dL (ref 1.5–2.5)

## 2013-12-22 LAB — CBC WITH DIFFERENTIAL/PLATELET
Basophils Absolute: 0 10*3/uL (ref 0.0–0.1)
Basophils Relative: 1 % (ref 0–1)
Eosinophils Absolute: 0.1 10*3/uL (ref 0.0–0.7)
Eosinophils Relative: 4 % (ref 0–5)
HCT: 41 % (ref 39.0–52.0)
Hemoglobin: 14.2 g/dL (ref 13.0–17.0)
Lymphocytes Relative: 23 % (ref 12–46)
Lymphs Abs: 0.9 10*3/uL (ref 0.7–4.0)
MCH: 30.3 pg (ref 26.0–34.0)
MCHC: 34.6 g/dL (ref 30.0–36.0)
MCV: 87.4 fL (ref 78.0–100.0)
Monocytes Absolute: 0.4 10*3/uL (ref 0.1–1.0)
Monocytes Relative: 11 % (ref 3–12)
Neutro Abs: 2.4 10*3/uL (ref 1.7–7.7)
Neutrophils Relative %: 61 % (ref 43–77)
Platelets: 144 10*3/uL — ABNORMAL LOW (ref 150–400)
RBC: 4.69 MIL/uL (ref 4.22–5.81)
RDW: 12.8 % (ref 11.5–15.5)
WBC: 3.8 10*3/uL — ABNORMAL LOW (ref 4.0–10.5)

## 2013-12-22 MED ORDER — ADULT MULTIVITAMIN W/MINERALS CH: 1.0000 | ORAL_TABLET | Freq: Every day | ORAL | Status: DC

## 2013-12-22 NOTE — Discharge Summary (Signed)
Physician Discharge Summary  Mark Bush ZOX:[id number] DOB: 11/22/1980 DOA: 12/20/2013  PCP: No PCP Per Patient  Admit date: 12/20/2013 Discharge date: 12/22/2013  Time spent: 50 minutes  Recommendations for Outpatient Follow-up:   1.  Please check LFTs 2. Thrombocytopenia due to alcoholism.  Please check CBC. 3. Continued counseling regarding abstinence from alcohol  Discharge Diagnoses:  Active Problems:   Alcohol withdrawal   Elevated liver function tests   Fatty liver, alcoholic   Discharge Condition: stable.    Diet recommendation: heart healthy diet  Filed Weights   12/20/13 1441 12/20/13 2200  Weight: 97.705 kg (215 lb 6.4 oz) 95.482 kg (210 lb 8 oz)    History of present illness:  This is a 33 y.o. year old male with prior hx/o alcohol abuse presenting with ETOH withdrawal. Patient's wife reports he drinks a 24 pack daily. Has been doing this for over 8 years. Patient states he quit drinking 3 days ago. Has a severe nausea, malaise, tremor since this point. Presents to the ER with worsening symptoms. Has had some mild right lower quadrant abdominal pain over this time frame. Lipase 53. AST/ALT 84/68. Cocaine + on UDS. ETOH level 16. ER physician concerned about withdrawals/seizures at home.  Hospital Course:  ETOH withdrawl  Patient does not speak [place].  Per wife (who is acting as a Nurse, learning disabilitytranslator) reports he is coherent and had no issues over night. On exam he has no tremor, is able to ambulate, and responds appropriately. Patient is committed to alcohol cessation. Please note, issues last drink was more than 4 days back.  Elevated AST/ALT  Secondary to chronic alcohol abuse.  Acute hep panel is negative. CT showed no acute intra abdominal abnormality. Diffuse fatty liver  PCP appointment arranged to check LFTs after discharge.  Thrombocytopenia Secondary to alcohol abuse Follow platelets after discharge.  Polysubstance abuse  Cocaine positive. Patient  states it was a one time use.  Social work has provided resources to assist with abstinence.    Discharge Exam: Filed Vitals:   12/22/13 0617  BP: 120/60  Pulse: 71  Temp: 97.8 F (36.6 C)  Resp: 16    General: Well developed, well nourished, NAD, appears stated age.  Wife at bedside. HEENT:  MMM. No pharyngeal erythema or exudates  Neck: Supple, no JVD, no masses  Cardiovascular: RRR, S1 S2 auscultated, no rubs, murmurs or gallops.  Respiratory: Clear to auscultation bilaterally with equal chest rise  Abdomen: Soft, nontender, nondistended, + bowel sounds  Extremities: warm dry without cyanosis clubbing or edema.  Neuro: AAOx3, cranial nerves grossly intact. Strength 5/5 in upper and lower extremities  Skin: Without rashes exudates or nodules.     Discharge Instructions       Discharge Orders   Future Appointments Provider Department Dept Phone   01/05/2014 11:30 AM Chw-Chww Financial Counselor [facility] [phone]   01/31/2014 9:00 AM Jeanann Lewandowskylugbemiga Jegede, MD [facility] [phone]   Future Orders Complete By Expires   Diet - low sodium heart healthy  As directed    Increase activity slowly  As directed        Medication List         multivitamin with minerals Tabs tablet  Take 1 tablet by mouth daily.       No Known Allergies Follow-up Information   Follow up with [facility]    .   Contact information:   [address] [place] [id number] [phone]      Please follow up. (awaiting call back with apt time)        The results of significant diagnostics from this hospitalization (including imaging, microbiology, ancillary and laboratory) are listed below for reference.    Significant Diagnostic Studies: Ct Abdomen Pelvis W Contrast  12/20/2013   CLINICAL DATA:  Right lower quadrant pain with non  EXAM: CT ABDOMEN AND PELVIS WITH CONTRAST  TECHNIQUE:  Multidetector CT imaging of the abdomen and pelvis was performed using the standard protocol following bolus administration of intravenous contrast.  CONTRAST:  100mL OMNIPAQUE IOHEXOL 300 MG/ML  SOLN  COMPARISON:  None.  FINDINGS: BODY WALL: Unremarkable.  LOWER CHEST: Unremarkable.  ABDOMEN/PELVIS:  Liver: Diffuse fatty infiltration.  No mass lesion.  Biliary: No evidence of biliary obstruction or stone.  Pancreas: Unremarkable.  Spleen: Unremarkable.  Adrenals: Unremarkable.  Kidneys and ureters: No hydronephrosis or stone.  Bladder: Symmetric, smooth apical bladder wall thickening is likely from underdistention given the normal urinalysis.  Reproductive: Unremarkable.  Bowel: No obstruction. Normal appendix.  Retroperitoneum: No mass or adenopathy.  Peritoneum: No free fluid or gas.  Vascular: No acute abnormality.  OSSEOUS: No acute abnormalities.  IMPRESSION: 1. No acute intra-abdominal abnormality.  Normal appendix. 2. Fatty liver.   Electronically Signed   By: Tiburcio PeaJonathan  Watts M.D.   On: 12/20/2013 23:45      Labs: Basic Metabolic Panel:  Recent Labs Lab 12/20/13 1445 12/20/13 2135 12/21/13 0405 12/22/13 0642  NA 139  --  140 140  K 3.9  --  3.6* 3.7  CL 98  --  100 104  CO2 22  --  24 23  GLUCOSE 85  --  78 101*  BUN 5*  --  9 6  CREATININE 0.63 0.76 0.75 0.64  CALCIUM 9.6  --  8.8 8.8  MG  --   --   --  2.1   Liver Function Tests:  Recent Labs Lab 12/20/13 1445 12/21/13 0405 12/22/13 0642  AST 84* 56* 63*  ALT 68* 51 62*  ALKPHOS 88 68 67  BILITOT 1.0 1.2 0.7  PROT 8.5* 6.7 6.7  ALBUMIN 4.7 3.7 3.5    Recent Labs Lab 12/20/13 1445  LIPASE 53    Recent Labs Lab 12/20/13 2135  AMMONIA 38   CBC:  Recent Labs Lab 12/20/13 1445 12/20/13 2135 12/21/13 0405 12/22/13 0642  WBC 5.1 4.3 5.4 3.8*  NEUTROABS 3.5  --  3.7 2.4  HGB 16.6 16.0 14.5 14.2  HCT 45.5 44.6 42.0 41.0  MCV 85.2 85.9 87.1 87.4  PLT 212 212 182 144*       Signed:  Conley CanalMarianne L  York, PA-C [phone]  [facility] 12/22/2013, 10:19 AM  Attending - Patient seen and examined him agree with the above assessment and plan. Heart any tremors, seems to be awake and alert, answers all questions appropriately (wife at bedside translating). Ms. Elyn PeersYork also spoke with patient afterwards with the help of a translator, patient is completely awake and alert. He is stable for discharge. He has been counseled extensively regarding importance of alcohol cessation, he is also been counseled regarding importance of not abusing cocaine. His claimed understanding.  Windell NorfolkS Nisreen Guise MD

## 2013-12-22 NOTE — Care Management Note (Signed)
    Page 1 of 1   12/22/2013     3:37:05 PM CARE MANAGEMENT NOTE 12/22/2013  Patient:  Mark Bush,Mark Bush   Account Number:  [id number]  Date Initiated:  12/22/2013  Documentation initiated by:  Letha CapeAYLOR,Ruthanne Mcneish  Subjective/Objective Assessment:   dx etoh withdrawal, thrombocytopenia  admit- lives with spouse. pta indep.     Action/Plan:   Anticipated DC Date:  12/22/2013   Anticipated DC Plan:  HOME/SELF CARE      DC Planning Services  CM consult  [facility]      Choice offered to / List presented to:             Status of service:  Completed, signed off Medicare Important Message given?   (If response is "NO", the following Medicare IM given date fields will be blank) Date Medicare IM given:   Date Additional Medicare IM given:    Discharge Disposition:  HOME/SELF CARE  Per UR Regulation:  Reviewed for med. necessity/level of care/duration of stay  If discussed at Long Length of Stay Meetings, dates discussed:    Comments:

## 2013-12-22 NOTE — Discharge Instructions (Addendum)
DO NOT DRINK ALCOHOL or use COCAINE.  Please go to your [facility] as the need to check your liver function tests to ensure they return to normal.  Further your platelets have dropped due to alcoholism.  You may bruise or bleed more easily.  Please return to the ER if you develop excessive bruising or bleeding.

## 2013-12-24 NOTE — ED Provider Notes (Signed)
Medical screening examination/treatment/procedure(s) were performed by non-physician practitioner and as supervising physician I was immediately available for consultation/collaboration.   EKG Interpretation   Date/Time:  Tuesday Dec 20 2013 14:54:38 EDT Ventricular Rate:  101 PR Interval:  138 QRS Duration: 86 QT Interval:  342 QTC Calculation: 443 R Axis:   49 Text Interpretation:  Sinus tachycardia Otherwise normal ECG ED PHYSICIAN  INTERPRETATION AVAILABLE IN [facility] Confirmed by TEST, Record  ([id number]) on 12/22/2013 7:11:12 AM        Rolland PorterMark Concepcion Kirkpatrick, MD 12/24/13 1919

## 2014-01-05 ENCOUNTER — Ambulatory Visit: Payer: Self-pay

## 2014-01-19 ENCOUNTER — Emergency Department (HOSPITAL_COMMUNITY): Payer: Self-pay

## 2014-01-19 ENCOUNTER — Encounter (HOSPITAL_COMMUNITY): Payer: Self-pay | Admitting: Emergency Medicine

## 2014-01-19 ENCOUNTER — Emergency Department (HOSPITAL_COMMUNITY)
Admission: EM | Admit: 2014-01-19 | Discharge: 2014-01-19 | Disposition: A | Discharge status: Home / Self Care | Payer: Self-pay | Attending: Emergency Medicine | Admitting: Emergency Medicine

## 2014-01-19 DIAGNOSIS — Y929 Unspecified place or not applicable: Secondary | ICD-10-CM | POA: Insufficient documentation

## 2014-01-19 DIAGNOSIS — S59909A Unspecified injury of unspecified elbow, initial encounter: Secondary | ICD-10-CM | POA: Insufficient documentation

## 2014-01-19 DIAGNOSIS — Z23 Encounter for immunization: Secondary | ICD-10-CM | POA: Insufficient documentation

## 2014-01-19 DIAGNOSIS — S66909A Unspecified injury of unspecified muscle, fascia and tendon at wrist and hand level, unspecified hand, initial encounter: Principal | ICD-10-CM | POA: Insufficient documentation

## 2014-01-19 DIAGNOSIS — S61511A Laceration without foreign body of right wrist, initial encounter: Secondary | ICD-10-CM

## 2014-01-19 DIAGNOSIS — S59919A Unspecified injury of unspecified forearm, initial encounter: Secondary | ICD-10-CM

## 2014-01-19 DIAGNOSIS — W268XXA Contact with other sharp object(s), not elsewhere classified, initial encounter: Secondary | ICD-10-CM | POA: Insufficient documentation

## 2014-01-19 DIAGNOSIS — Z79899 Other long term (current) drug therapy: Secondary | ICD-10-CM | POA: Insufficient documentation

## 2014-01-19 DIAGNOSIS — S6990XA Unspecified injury of unspecified wrist, hand and finger(s), initial encounter: Secondary | ICD-10-CM

## 2014-01-19 DIAGNOSIS — Z8719 Personal history of other diseases of the digestive system: Secondary | ICD-10-CM | POA: Insufficient documentation

## 2014-01-19 DIAGNOSIS — S61509A Unspecified open wound of unspecified wrist, initial encounter: Secondary | ICD-10-CM | POA: Insufficient documentation

## 2014-01-19 DIAGNOSIS — Y9389 Activity, other specified: Secondary | ICD-10-CM | POA: Insufficient documentation

## 2014-01-19 DIAGNOSIS — S66921A Laceration of unspecified muscle, fascia and tendon at wrist and hand level, right hand, initial encounter: Secondary | ICD-10-CM

## 2014-01-19 HISTORY — DX: Unspecified cirrhosis of liver: K74.60

## 2014-01-19 MED ORDER — TETANUS-DIPHTH-ACELL PERTUSSIS 5-2.5-18.5 LF-MCG/0.5 IM SUSP
0.5000 mL | Freq: Once | INTRAMUSCULAR | Status: AC
  Administered 2014-01-19: 0.5 mL via INTRAMUSCULAR
  Filled 2014-01-19: qty 0.5

## 2014-01-19 MED ORDER — CEPHALEXIN 500 MG PO CAPS: 500.0000 mg | ORAL_CAPSULE | Freq: Four times a day (QID) | ORAL | Status: DC

## 2014-01-19 MED ORDER — OXYCODONE HCL 5 MG PO TABS: 10.0000 mg | ORAL_TABLET | ORAL | Status: DC | PRN

## 2014-01-19 MED ORDER — HYDROMORPHONE HCL PF 1 MG/ML IJ SOLN
1.0000 mg | Freq: Once | INTRAMUSCULAR | Status: AC
  Administered 2014-01-19: 1 mg via INTRAVENOUS
  Filled 2014-01-19: qty 1

## 2014-01-19 NOTE — ED Notes (Signed)
Pt ambulated to exit by RN.

## 2014-01-19 NOTE — ED Notes (Signed)
Hand Surgeon still in patient's room.

## 2014-01-19 NOTE — ED Notes (Signed)
Tatyana, PA at the bedside  

## 2014-01-19 NOTE — ED Notes (Signed)
Patient returned from X-ray 

## 2014-01-19 NOTE — Consult Note (Signed)
Reason for Consult: Laceration right forearm Referring Physician: ER physician  Porfirio MylarJuan Garcia-Salazar is an 33 y.o. male.  HPI: Status post glass laceration to the forearm with tendon and soft tissue injury. I was called to see the to the tendon involvement and large open gaping wound.  Patient denies other issues or problems.  Patient denies neck back chest or abdominal pain. He is here with his family.  He is alert and oriented. He has no allergies  Past Medical History  Diagnosis Date  . Liver cirrhosis     History reviewed. No pertinent past surgical history.  No family history on file.  Social History:  reports that he has never smoked. He does not have any smokeless tobacco history on file. He reports that he drinks about 14.4 ounces of alcohol per week. He reports that he does not use illicit drugs.  Allergies: No Known Allergies  Medications: I have reviewed the patient's current medications.  No results found for this or any previous visit (from the past 48 hour(s)).  Dg Forearm Right  01/19/2014   CLINICAL DATA:  Laceration distal anterior right forearm  EXAM: RIGHT FOREARM - 2 VIEW  COMPARISON:  None.  FINDINGS: No fracture is identified. There is a radiopaque foreign body projecting over the soft tissues of the lateral forearm at the level of the proximal radius.  IMPRESSION: Radiopaque foreign body projecting over the soft tissues of the lateral forearm at the level of the proximal radius.   Electronically Signed   By: Christiana PellantGretchen  Green M.D.   On: 01/19/2014 17:34    Review of Systems  HENT: Negative.   Eyes: Negative.   Respiratory: Negative.   Cardiovascular: Negative.   Gastrointestinal: Negative.   Genitourinary: Negative.   Neurological: Negative.   Endo/Heme/Allergies: Negative.   Psychiatric/Behavioral: Negative.    Blood pressure 117/66, pulse 90, temperature 98.9 F (37.2 C), temperature source Oral, resp. rate 15, SpO2 97.00%. Physical Exam Large  laceration to the distal third of the forearm right upper extremity. He does have a refill and has some dysesthesia about the median nerve distribution. I reviewed this at length and his findings. Ulnar nerve appears to be intact. He has flexion and extension about the thumb. No evidence of compartment syndrome. He has an obvious tendon injury to the FCR and palmaris longus. The laceration is quite deep.  The patient is alert and oriented in no acute distress the patient complains of pain in the affected upper extremity.  The patient is noted to have a normal HEENT exam.  Lung fields show equal chest expansion and no shortness of breath  abdomen exam is nontender without distention.  Lower extremity examination does not show any fracture dislocation or blood clot symptoms.  Pelvis is stable neck and back are stable and nontender Assessment/Plan: We will plan for irrigation and debridement and repair reconstruction as necessary. I discussed with the patient all issues.  We are planning surgery for your upper extremity. The risk and benefits of surgery include risk of bleeding infection anesthesia damage to normal structures and failure of the surgery to accomplish its intended goals of relieving symptoms and restoring function with this in mind we'll going to proceed. I have specifically discussed with the patient the pre-and postoperative regime and the does and don'ts and risk and benefits in great detail. Risk and benefits of surgery also include risk of dystrophy chronic nerve pain failure of the healing process to go onto completion and other inherent risks of  surgery The relavent the pathophysiology of the disease/injury process, as well as the alternatives for treatment and postoperative course of action has been discussed in great detail with the patient who desires to proceed.  We will do everything in our power to help you (the patient) restore function to the upper extremity. Is a pleasure to  see this patient today.  See dictation #[id number] Dominica Severin 01/19/2014, 7:44 PM

## 2014-01-19 NOTE — ED Notes (Signed)
Hand Surgeon at the bedside.  

## 2014-01-19 NOTE — Discharge Instructions (Signed)

## 2014-01-19 NOTE — ED Provider Notes (Signed)
CSN: [id number]     Arrival date & time 01/19/14  1643 History   First MD Initiated Contact with Patient 01/19/14 1644     Chief Complaint  Patient presents with  . Extremity Laceration     (Consider location/radiation/quality/duration/timing/severity/associated sxs/prior Treatment) HPI Mark Bush is a 33 y.o. male who presents to ED with complaint of laceration to the right wrist. PT states he was opening a window and glass broke, cutting him on the wrist. Pt unable to stop bleeding, ems was called. Bleeding controlled with firm pressure dressing. Pt having pain with movement of the wrist and fingers. Denies any numbness or weakness to the hand or fingers. No other injuries. Tetanus unknown.   Past Medical History  Diagnosis Date  . Liver cirrhosis    History reviewed. No pertinent past surgical history. No family history on file. History  Substance Use Topics  . Smoking status: Never Smoker   . Smokeless tobacco: Not on file  . Alcohol Use: 14.4 oz/week    24 Cans of beer per week     Comment: 24 packs    Review of Systems  Constitutional: Negative for fever and chills.  Skin: Positive for wound. Negative for color change.  Neurological: Negative for weakness and numbness.  All other systems reviewed and are negative.     Allergies  Review of patient's allergies indicates no known allergies.  Home Medications   Prior to Admission medications   Medication Sig Start Date End Date Taking? Authorizing Provider  Multiple Vitamin (MULTIVITAMIN WITH MINERALS) TABS tablet Take 1 tablet by mouth daily. 12/22/13   Marianne L York, PA-C   BP 130/66  Pulse 99  Temp(Src) 98.7 F (37.1 C) (Oral)  Resp 21  SpO2 97% Physical Exam  Nursing note and vitals reviewed. Constitutional: He appears well-developed and well-nourished. No distress.  Cardiovascular: Normal rate.   Pulmonary/Chest: Effort normal.  Musculoskeletal:  7cm vertical laceration to the anterior right  distal wrist.  Hemostatic at this time. Laceration deep through fascia, gaping. Full ROM of all fingers. Pain with ROM of 4th and 5th fingers at MCP joints. Pain with wrist flexion, unable to flex all the way, states it is very painful. Able to make fist. Able to spread all fingers. Able to flex, extend, adduct and abduct thumb. Distal radial pulses intact. Hand is pink, warm, cap refill <2sec distally  Skin: Skin is warm and dry.    ED Course  Procedures (including critical care time) Labs Review Labs Reviewed - No data to display  Imaging Review Dg Forearm Right  01/19/2014   CLINICAL DATA:  Laceration distal anterior right forearm  EXAM: RIGHT FOREARM - 2 VIEW  COMPARISON:  None.  FINDINGS: No fracture is identified. There is a radiopaque foreign body projecting over the soft tissues of the lateral forearm at the level of the proximal radius.  IMPRESSION: Radiopaque foreign body projecting over the soft tissues of the lateral forearm at the level of the proximal radius.   Electronically Signed   By: Christiana PellantGretchen  Green M.D.   On: 01/19/2014 17:34     EKG Interpretation None      MDM   Final diagnoses:  Laceration of right wrist with tendon involvement     Pt with laceration to the right anterior wrist. Wound numbed and explored. 2% with epi used, 4cc, local infiltration. It appears pt has lacerated through two tendons. Pt does have good strength of the hand and fingers with no apparent vascular compromise  and sensation intact.   Discussed with Dr. Amanda Pea who came and saw pt.    8:14 PM  Wound repaired and splinted by Dr. Amanda Pea, prescriptions and fu instructions provided by him. Pt ready for d/c home.   Filed Vitals:   01/19/14 1800 01/19/14 1830 01/19/14 1834 01/19/14 1900  BP: 126/67 126/74 126/74 117/66  Pulse: 93 101 95 90  Temp:   98.9 F (37.2 C)   TempSrc:   Oral   Resp: 14 23 18 15   SpO2: 98% 96% 97% 97%     Lottie Mussel, PA-C 01/19/14 2014

## 2014-01-19 NOTE — ED Notes (Addendum)
Per EMS, Patient was opening a window and the glass broke. Per EMS, Patient has a three inch laceration with fatty tissue exposed and tendon exposed. Vitals per EMS: 150/80, 100 HR, 18 RR, 100 % on RA. 150 Mcgs of Fentanyl Given

## 2014-01-20 NOTE — ED Provider Notes (Signed)
Medical screening examination/treatment/procedure(s) were conducted as a shared visit with non-physician practitioner(s) and myself.  I personally evaluated the patient during the encounter.   EKG Interpretation None      Pt with tendon laceration with glass.  No FB.  Pt has good strength of hand.  Hand consulted and came and repaired tendon with possible median nerve injury  Gwyneth Sprout, MD 01/20/14 0005

## 2014-01-23 NOTE — Op Note (Signed)
NAMMarland Kitchen:  Mark Bush, Mark Bush         ACCOUNT NO.:  [id number]  MEDICAL RECORD NO.:  [id number]  LOCATION:                                 FACILITY:  PHYSICIAN:  Dionne AnoWilliam M. Lamia Mariner, M.D.DATE OF BIRTH:  [birth year]  DATE OF PROCEDURE:  01/19/2014 DATE OF DISCHARGE:  01/19/2014                              OPERATIVE REPORT   PREOPERATIVE DIAGNOSIS:  An 8-cm laceration, distal third volar radial forearm with tendon and fascial involvement.  POSTOPERATIVE DIAGNOSES:  Flexor digitorum superficialis laceration to the middle finger, flexor carpi radialis laceration, palmaris laceration, contusive and hemorrhage injury to the median nerve, but no focal disruption noted on neurolysis.  PROCEDURE: 1. Irrigation and debridement of skin, subcutaneous tissue, muscle,     tendon, and associated soft tissue structures.  This was an     excisional debridement with curette, knife, blade, and scissor. 2. Median nerve extensive neurolysis and decompression distal third     forearm. 3. Palmaris longus tenotomy. 4. Flexor digitorum superficialis to the middle finger repair. 5. Flexor carpi radialis repair. 6. Complex closure 8-cm wound distal third of the forearm.  SURGEON:  Dionne AnoWilliam M. Amanda PeaGramig, M.D.  ASSISTANT:  Karie ChimeraBrian Buchanan, P.A.-C.  COMPLICATIONS:  None.  ANESTHESIA:  Block anesthetic.  INDICATIONS:  This 33 year old, Hispanic male, who sustained an injury __________.  He understands risks and benefits surgery including risk of infection, bleeding, anesthesia, damage to normal structures, and failure of surgery to accomplish its intended goals of relieving symptoms and restoring function.  With this in mind, he desires to proceed.  All questions have been encouraged and answered preoperatively.  OPERATIVE PROCEDURE:  The patient was seen by myself, underwent a field block.  He already had some degree of block anesthetic from the emergency room staff of course.  Following this, we performed  2 separate Betadine scrub and paints with hydrogen peroxide placed about __________ areas.  I very meticulously placed 2-3 L saline to the area and 2 separate scrubs were performed.  We were meticulous with our wound care.  Following this, we performed very careful and cautious look to the extremity and isolated it with sterile field.  Following this, time-out was called.  Once this done, we performed I and D/irrigation and debridement of skin, subcutaneous tissue, muscle, and tendon.  This was done without difficulty to my satisfaction with 3 L of saline.  This was a meticulous dissection.  Following this, we performed median nerve neurolysis and extensive decompression at the distal third of the forearm.  There was some hemorrhage from what appeared to be injury to the median artery but there was not a focal disruption of the median nerve.  A thorough and extensive neurolysis was accomplished.  The patient tolerated this well. There were no issues.  Following the neurolysis, I then identified the palmar cutaneous branch of the median nerve under 4.0 loupe magnification and was pleased with the integrity for the most part.  There are no complicating features. Hopefully, the hemorrhage will resolve.  Once this was accomplished, the palmaris longus was identified.  I did not feel that this __________ repair and I performed palmaris longus tenotomy of the flexor tendon at the distal wrist region.  __________ very  sacrificeable.  Following this, we performed a flexor carpi radialis 4-strand repair with 3-0 FiberWire utilizing a modified Kessler-Tajima type stitch. Following this, we performed a flexor digitorum superficialis repair with nonabsorbable suture as well.  Following this, I once again ensured the compartments were released which I did during the initial dissection and hemostasis was adequate.  We performed an additional __________ in the wound and then closed it with a  combination of 3-0 Prolene and 4-0 Prolene.  The patient tolerated this well.  There were no complicating features.  The patient was dressed with Adaptic, Xeroform gauze, Webril, Kerlix, and a Munster type splint.  We will see him in the office in 12 days. These notes have been discussed.  All questions have been encouraged and answered __________.     Dionne Ano. Amanda Pea, M.D.     [facility]  D:  01/19/2014  T:  01/20/2014  Job:  [id number]

## 2014-01-28 ENCOUNTER — Emergency Department (HOSPITAL_COMMUNITY)
Admission: EM | Admit: 2014-01-28 | Discharge: 2014-01-28 | Disposition: A | Discharge status: Home / Self Care | Payer: Self-pay | Attending: Emergency Medicine | Admitting: Emergency Medicine

## 2014-01-28 ENCOUNTER — Encounter (HOSPITAL_COMMUNITY): Payer: Self-pay | Admitting: Emergency Medicine

## 2014-01-28 DIAGNOSIS — Z4789 Encounter for other orthopedic aftercare: Secondary | ICD-10-CM | POA: Insufficient documentation

## 2014-01-28 DIAGNOSIS — Z4689 Encounter for fitting and adjustment of other specified devices: Secondary | ICD-10-CM

## 2014-01-28 DIAGNOSIS — Z792 Long term (current) use of antibiotics: Secondary | ICD-10-CM | POA: Insufficient documentation

## 2014-01-28 DIAGNOSIS — Z8719 Personal history of other diseases of the digestive system: Secondary | ICD-10-CM | POA: Insufficient documentation

## 2014-01-28 DIAGNOSIS — Z79899 Other long term (current) drug therapy: Secondary | ICD-10-CM | POA: Insufficient documentation

## 2014-01-28 MED ORDER — HYDROCODONE-IBUPROFEN 7.5-200 MG PO TABS: 1.0000 | ORAL_TABLET | Freq: Four times a day (QID) | ORAL | Status: DC | PRN

## 2014-01-28 MED ORDER — HYDROCODONE-ACETAMINOPHEN 5-325 MG PO TABS: 1.0000 | ORAL_TABLET | Freq: Once | ORAL | Status: DC

## 2014-01-28 MED ORDER — IBUPROFEN 800 MG PO TABS
800.0000 mg | ORAL_TABLET | Freq: Once | ORAL | Status: AC
  Administered 2014-01-28: 800 mg via ORAL
  Filled 2014-01-28: qty 1

## 2014-01-28 NOTE — ED Notes (Signed)
The pt had a laceration on the rt hand with glass June4th.  todau he was showering and he got his bandage wet when his plastic bag ripped.  His bandage is soaked

## 2014-01-28 NOTE — Discharge Instructions (Signed)
Please follow up with Dr. Carlos LeveringGramig's office, to get your scheduled followup appointment as he recommended he be seen 12 days after operation. Please keep this cast clean, dry, covered. Please take pain medication and/or muscle relaxants as prescribed and as needed for pain. Please do not drive on narcotic pain medication or on muscle relaxants. Please read all discharge instructions and return precautions.    Cuidados del yeso o la frula (Cast or Splint Care) El yeso y las frulas sostienen los miembros lesionados y evitan que los huesos se muevan hasta que se curen. Es importante que cuide el yeso o la frula cuando se encuentre en su casa.  INSTRUCCIONES PARA EL CUIDADO EN EL HOGAR  Mantenga el yeso o la frula al descubierto durante el tiempo de secado. Puede tardar Mark Bush 24 y 48 horas para secarse si est hecho de yeso. La fibra de vidrio se seca en menos de 1 hora.  No apoye el yeso sobre nada que sea ms duro que una almohada durante 24 horas.  No aplique peso sobre el miembro lesionado ni haga presin sobre el yeso hasta que el mdico lo autorice.  Mantenga el yeso o la frula secos. Al mojarse pueden perder la forma y podra ocurrir que no soporten el Sherwood Manormiembro. Un yeso mojado que ha perdido su forma puede presionar de Wellsite geologistmanera peligrosa en la piel al secarse. Adems, la piel mojada podra infectarse.  Cubra el yeso o la frula con una bolsa plstica cuando tome un bao o cuando salga al exterior en das de lluvia o nieve. Si el yeso est colocado sobre el tronco, deber baarse pasando una esponja por el cuerpo, hasta que se lo retiren.  Si el yeso se moja, squelo con una toalla o con un secador de cabello slo en posicin de aire fro.  Mantenga el yeso o la frula limpios. Si el yeso se ensucia, puede limpiarlo con un pao hmedo.  No coloque objetos extraos duros o blandos debajo del yeso o cabestrillo, como algodn, papel higinico, locin o talco.  No se rasque la piel por debajo  del molde con ningn objeto. Podra quedar adherido al yeso. Adems, el rascado puede causar una infeccin. Si siente picazn, use un secador de cabello con aire fro Intelsobre la zona que pica para Altria Groupaliviar las molestias.  No recorte ni quite el relleno acolchado que se encuentra debajo del yeso.  Ejercite todas las articulaciones que no estn inmovilizadas por el yeso o frula. Por ejemplo, si tiene un yeso largo de pierna, ejercite la articulacin de la cadera y los dedos de los pies. Si tiene un brazo Harley-Davidsonenyesado o entablillado, ejercite el hombro, el codo, el pulgar y los dedos de la Zoarmano.  Eleve el brazo o la pierna sobre 1  2 almohadas durante los primeros 3 das para disminuir la hinchazn y Chief Technology Officerel dolor.Es mejor si puede elevar cmodamente el yeso para que quede ms Seychellesarriba del nivel del corazn. SOLICITE ATENCIN MDICA SI:   El yeso o la frula se quiebran.  Siente que el yeso o la frula estn muy apretados o muy flojos.  Tiene una picazn insoportable debajo del yeso.  El yeso se moja o tiene una zona blanda.  Siente un feo Thrivent Financialolor que proviene del interior del Coaltonyeso.  Algn objeto se queda atascado bajo el yeso.  La piel que rodea el yeso enrojece o se vuelve sensible.  Siente un dolor nuevo o el dolor que senta empeora luego de la aplicacin del yeso. SOLICITE  ATENCIN MDICA DE INMEDIATO SI:   Observa un lquido que sale por el yeso.  No puede mover el dedo lesionado.  Los dedos le cambian de color (blancos o azules), siente fro, Engineer, miningdolor o por fuera del yeso los dedos estn muy inflamados.  Siente hormigueo o adormecimiento alrededor de la zona de la lesin.  Siente un dolor o presin intensos debajo del yeso.  Presenta dificultad para respirar o Company secretaryle falta el aire.  Siente dolor en el pecho. Document Released: 08/04/2005 Document Revised: 05/25/2013 [facility] Patient Information 2014 [place], [place].

## 2014-01-28 NOTE — ED Provider Notes (Signed)
CSN: [id number]     Arrival date & time 01/28/14  1847 History   First MD Initiated Contact with Patient 01/28/14 1912     Chief Complaint  Patient presents with  . wound needs redressing      (Consider location/radiation/quality/duration/timing/severity/associated sxs/prior Treatment) HPI Comments: Patient is a 33 year old male past medical history significant for liver cirrhosis presented to the emergency department for right splint replacement. Patient had a distal third volar radial forearm with tendon and fascial involvement repair performed by Dr. Amanda PeaGramig on June 5. Patient states he was showering today when the plastic bag he is to cover his splint wrapped causing his splint to get soaked. He states he still had some mild throbbing pain, he has run out of his pain medication that Dr. Essie ChristineGremminger prescribed. He has not called his office to discuss this. He has not had any documented fevers or chills.   Past Medical History  Diagnosis Date  . Liver cirrhosis    History reviewed. No pertinent past surgical history. No family history on file. History  Substance Use Topics  . Smoking status: Never Smoker   . Smokeless tobacco: Not on file  . Alcohol Use: 14.4 oz/week    24 Cans of beer per week     Comment: 24 packs    Review of Systems  Constitutional: Negative for fever and chills.  Skin: Positive for wound (healing laceration).  All other systems reviewed and are negative.     Allergies  Review of patient's allergies indicates no known allergies.  Home Medications   Prior to Admission medications   Medication Sig Start Date End Date Taking? Authorizing Provider  cephALEXin (KEFLEX) 500 MG capsule Take 1 capsule (500 mg total) by mouth 4 (four) times daily. 01/19/14   Dominica SeverinWilliam Gramig, MD  HYDROcodone-ibuprofen (VICOPROFEN) 7.5-200 MG per tablet Take 1 tablet by mouth every 6 (six) hours as needed for severe pain. 01/28/14   Nashya Garlington L Brissa Asante, PA-C  Multiple Vitamin  (MULTIVITAMIN WITH MINERALS) TABS tablet Take 1 tablet by mouth daily. 12/22/13   Tora KindredMarianne L York, PA-C  oxyCODONE (OXY IR/ROXICODONE) 5 MG immediate release tablet Take 2 tablets (10 mg total) by mouth every 4 (four) hours as needed for severe pain. 01/19/14   Dominica SeverinWilliam Gramig, MD   BP 121/79  Pulse 92  Temp(Src) 98.3 F (36.8 C) (Oral)  Resp 18  SpO2 96% Physical Exam  Nursing note and vitals reviewed. Constitutional: He is oriented to person, place, and time. He appears well-developed and well-nourished. No distress.  HENT:  Head: Normocephalic and atraumatic.  Right Ear: External ear normal.  Left Ear: External ear normal.  Nose: Nose normal.  Mouth/Throat: Oropharynx is clear and moist.  Eyes: Conjunctivae are normal.  Neck: Normal range of motion. Neck supple.  Cardiovascular: Normal rate, regular rhythm, normal heart sounds and intact distal pulses.   Pulmonary/Chest: Effort normal and breath sounds normal. No respiratory distress.  Abdominal: Soft.  Musculoskeletal:       Arms: Neurological: He is alert and oriented to person, place, and time.  Skin: Skin is warm and dry. He is not diaphoretic.  Psychiatric: He has a normal mood and affect.    ED Course  Procedures (including critical care time) Medications  ibuprofen (ADVIL,MOTRIN) tablet 800 mg (not administered)    Labs Review Labs Reviewed - No data to display  Imaging Review No results found.   EKG Interpretation None      MDM   Final diagnoses:  Orthopedic  cast removal    Filed Vitals:   01/28/14 1945  BP: 121/79  Pulse: 92  Temp:   Resp: 18   Afebrile, NAD, non-toxic appearing, AAOx4.   Neurovascularly intact. Normal sensation. This is clean dry and intact. There is no evidence to suggest acute compartment syndrome. No abnormalities noted under old splint. Will provide limited prescription refill for ibuprofen-based pain medication, advised patient needs to followup with Dr. Amanda PeaGramig at scheduled  follow up appointment. Return precautions discussed. Patient is agreeable to plan. Patient is stable at time of discharge      Jeannetta EllisJennifer L Fadia Marlar, PA-C 01/28/14 2005

## 2014-01-28 NOTE — ED Provider Notes (Signed)
Medical screening examination/treatment/procedure(s) were performed by non-physician practitioner and as supervising physician I was immediately available for consultation/collaboration.   EKG Interpretation None        Enid SkeensJoshua M Palmer Shorey, MD 01/28/14 2051

## 2014-01-28 NOTE — Progress Notes (Signed)
Orthopedic Tech Progress Note Patient Details:  Mark MylarJuan Bush 07/24/1981 [id number]  Ortho Devices Type of Ortho Device: Ace wrap;Volar splint Ortho Device/Splint Location: RUE Ortho Device/Splint Interventions: Ordered;Application   Jennye MoccasinHughes, [name] 01/28/2014, 8:03 PM

## 2014-01-28 NOTE — ED Notes (Signed)
Pt here on June 12th and had a plaster splint placed on right forearm. Was told not to get it wet. Pt took a shower and got cast wet and came back tot he ER for another cast.

## 2014-01-31 ENCOUNTER — Inpatient Hospital Stay: Payer: Self-pay | Admitting: Internal Medicine

## 2014-01-31 ENCOUNTER — Ambulatory Visit: Payer: Self-pay

## 2014-02-07 ENCOUNTER — Inpatient Hospital Stay: Payer: Self-pay | Admitting: Internal Medicine

## 2014-04-29 ENCOUNTER — Emergency Department (HOSPITAL_COMMUNITY)
Admission: EM | Admit: 2014-04-29 | Discharge: 2014-04-30 | Disposition: A | Discharge status: Other Acute Inpatient Hospital | Payer: MEDICAID | Attending: Emergency Medicine | Admitting: Emergency Medicine

## 2014-04-29 ENCOUNTER — Encounter (HOSPITAL_COMMUNITY): Payer: Self-pay | Admitting: Emergency Medicine

## 2014-04-29 DIAGNOSIS — F1020 Alcohol dependence, uncomplicated: Secondary | ICD-10-CM | POA: Insufficient documentation

## 2014-04-29 DIAGNOSIS — R61 Generalized hyperhidrosis: Secondary | ICD-10-CM | POA: Insufficient documentation

## 2014-04-29 DIAGNOSIS — F1010 Alcohol abuse, uncomplicated: Secondary | ICD-10-CM | POA: Insufficient documentation

## 2014-04-29 DIAGNOSIS — F10230 Alcohol dependence with withdrawal, uncomplicated: Secondary | ICD-10-CM

## 2014-04-29 DIAGNOSIS — F411 Generalized anxiety disorder: Secondary | ICD-10-CM | POA: Insufficient documentation

## 2014-04-29 DIAGNOSIS — IMO0002 Reserved for concepts with insufficient information to code with codable children: Secondary | ICD-10-CM | POA: Insufficient documentation

## 2014-04-29 DIAGNOSIS — R1084 Generalized abdominal pain: Secondary | ICD-10-CM | POA: Insufficient documentation

## 2014-04-29 DIAGNOSIS — F10939 Alcohol use, unspecified with withdrawal, unspecified: Secondary | ICD-10-CM | POA: Insufficient documentation

## 2014-04-29 DIAGNOSIS — F10239 Alcohol dependence with withdrawal, unspecified: Secondary | ICD-10-CM | POA: Insufficient documentation

## 2014-04-29 DIAGNOSIS — Z8719 Personal history of other diseases of the digestive system: Secondary | ICD-10-CM | POA: Insufficient documentation

## 2014-04-29 LAB — COMPREHENSIVE METABOLIC PANEL
ALK PHOS: 78 U/L (ref 39–117)
ALT: 75 U/L — AB (ref 0–53)
AST: 85 U/L — AB (ref 0–37)
Albumin: 4.3 g/dL (ref 3.5–5.2)
Anion gap: 15 (ref 5–15)
BUN: 4 mg/dL — ABNORMAL LOW (ref 6–23)
CO2: 24 meq/L (ref 19–32)
Calcium: 8.5 mg/dL (ref 8.4–10.5)
Chloride: 107 mEq/L (ref 96–112)
Creatinine, Ser: 0.64 mg/dL (ref 0.50–1.35)
GLUCOSE: 96 mg/dL (ref 70–99)
POTASSIUM: 4 meq/L (ref 3.7–5.3)
SODIUM: 146 meq/L (ref 137–147)
Total Bilirubin: 0.4 mg/dL (ref 0.3–1.2)
Total Protein: 8 g/dL (ref 6.0–8.3)

## 2014-04-29 LAB — CBC
HCT: 43.2 % (ref 39.0–52.0)
HEMOGLOBIN: 14.9 g/dL (ref 13.0–17.0)
MCH: 31.4 pg (ref 26.0–34.0)
MCHC: 34.5 g/dL (ref 30.0–36.0)
MCV: 90.9 fL (ref 78.0–100.0)
Platelets: 190 10*3/uL (ref 150–400)
RBC: 4.75 MIL/uL (ref 4.22–5.81)
RDW: 12.8 % (ref 11.5–15.5)
WBC: 3.9 10*3/uL — ABNORMAL LOW (ref 4.0–10.5)

## 2014-04-29 LAB — ETHANOL: Alcohol, Ethyl (B): 380 mg/dL — ABNORMAL HIGH (ref 0–11)

## 2014-04-29 LAB — ACETAMINOPHEN LEVEL

## 2014-04-29 LAB — SALICYLATE LEVEL: Salicylate Lvl: <2 mg/dL — ABNORMAL LOW (ref 2.8–20.0)

## 2014-04-29 MED ORDER — ONDANSETRON HCL 4 MG/2ML IJ SOLN
4.0000 mg | Freq: Once | INTRAMUSCULAR | Status: AC
  Administered 2014-04-29: 4 mg via INTRAVENOUS
  Filled 2014-04-29: qty 2

## 2014-04-29 MED ORDER — THIAMINE HCL 100 MG/ML IJ SOLN
100.0000 mg | Freq: Every day | INTRAMUSCULAR | Status: DC
  Administered 2014-04-29: 100 mg via INTRAVENOUS
  Filled 2014-04-29 (×2): qty 2

## 2014-04-29 MED ORDER — LORAZEPAM 2 MG/ML IJ SOLN
0.0000 mg | Freq: Four times a day (QID) | INTRAMUSCULAR | Status: DC
  Administered 2014-04-29 – 2014-04-30 (×2): 2 mg via INTRAVENOUS
  Filled 2014-04-29 (×2): qty 1

## 2014-04-29 MED ORDER — PANTOPRAZOLE SODIUM 40 MG IV SOLR
40.0000 mg | Freq: Once | INTRAVENOUS | Status: AC
  Administered 2014-04-29: 40 mg via INTRAVENOUS
  Filled 2014-04-29: qty 40

## 2014-04-29 MED ORDER — LORAZEPAM 1 MG PO TABS
1.0000 mg | ORAL_TABLET | Freq: Three times a day (TID) | ORAL | Status: DC | PRN
  Administered 2014-04-30: 1 mg via ORAL
  Filled 2014-04-29: qty 1

## 2014-04-29 MED ORDER — LORAZEPAM 2 MG/ML IJ SOLN: 0.0000 mg | Freq: Two times a day (BID) | INTRAMUSCULAR | Status: DC

## 2014-04-29 MED ORDER — VITAMIN B-1 100 MG PO TABS
100.0000 mg | ORAL_TABLET | Freq: Every day | ORAL | Status: DC
  Administered 2014-04-30: 100 mg via ORAL
  Filled 2014-04-29: qty 1

## 2014-04-29 MED ORDER — SODIUM CHLORIDE 0.9 % IV BOLUS (SEPSIS)
1000.0000 mL | Freq: Once | INTRAVENOUS | Status: AC
  Administered 2014-04-29: 1000 mL via INTRAVENOUS

## 2014-04-29 NOTE — ED Notes (Signed)
Pt states he used bathroom before he came to hospital therefore he is unable to urinate at this time.

## 2014-04-29 NOTE — ED Notes (Signed)
TTS in progress 

## 2014-04-29 NOTE — BH Assessment (Signed)
Tele Assessment Note   Mark Bush is an 33 y.o. male presenting to [facility] ED requesting alcohol detox. PT denies SI, HI and AVH at this time. Pt did not report any previous suicide attempts or psychiatric hospitalizations. PT reported that he has been drinking daily for the past 4 months. Pt shared that he completed a detox program in May 2015 but was unable to provide the name of the facility. Pt did not report any illicit substance use. Pt denied having access to weapons and did not report any pending criminal charges or upcoming court dates. PT did not report any physical, sexual or emotional abuse. Pt is alert and oriented x3. Pt is calm and cooperative throughout this assessment. Pt maintained good eye contact. PT speech was normal and thought process was coherent and relevant. Pt motor activity appears within normal limit. Pt mood is euthymic and his affect is congruent with his mood.  Inpatient treatment has been recommended.   Axis I: Alcohol Abuse Axis II: Deferred Axis III:  Past Medical History  Diagnosis Date  . Liver cirrhosis    Axis IV: other psychosocial or environmental problems Axis V: 41-50 serious symptoms  Past Medical History:  Past Medical History  Diagnosis Date  . Liver cirrhosis     History reviewed. No pertinent past surgical history.  Family History: No family history on file.  Social History:  reports that he has never smoked. He does not have any smokeless tobacco history on file. He reports that he drinks about 14.4 ounces of alcohol per week. He reports that he does not use illicit drugs.  Additional Social History:  Alcohol / Drug Use History of alcohol / drug use?: Yes Longest period of sobriety (when/how long): 3 mos Negative Consequences of Use: Personal relationships Withdrawal Symptoms: Nausea / Vomiting Substance #1 Name of Substance 1: Alcohol  1 - Age of First Use: 20  1 - Amount (size/oz): "9-240z" 1 - Frequency: daily  1 -  Duration: 4 months  1 - Last Use / Amount: 04-29-14 "not much"  CIWA: CIWA-Ar BP: 131/79 mmHg Pulse Rate: 98 Nausea and Vomiting: 6 Tactile Disturbances: none Tremor: moderate, with patient's arms extended Auditory Disturbances: not present Paroxysmal Sweats: no sweat visible Visual Disturbances: not present Anxiety: mildly anxious Headache, Fullness in Head: moderate Agitation: normal activity Orientation and Clouding of Sensorium: oriented and can do serial additions CIWA-Ar Total: 14 COWS:    PATIENT STRENGTHS: (choose at least two) Average or above average intelligence Capable of independent living  Allergies: No Known Allergies  Home Medications:  (Not in a hospital admission)  OB/GYN Status:  No LMP for male patient.  General Assessment Data Location of Assessment: [facility] ED Is this a Tele or Face-to-Face Assessment?: Tele Assessment Is this an Initial Assessment or a Re-assessment for this encounter?: Initial Assessment Living Arrangements: Spouse/significant other Can pt return to current living arrangement?: Yes Admission Status: Voluntary Is patient capable of signing voluntary admission?: Yes Transfer from: Home Referral Source: Self/Family/Friend     [facility] Crisis Care Plan Living Arrangements: Spouse/significant other Name of Psychiatrist: None reported Name of Therapist: None reported   Education Status Is patient currently in school?: No Current Grade: NA Highest grade of school patient has completed: NA Name of school: NA Contact person: NA  Risk to self with the past 6 months Suicidal Ideation: No Suicidal Intent: No Is patient at risk for suicide?: No Suicidal Plan?: No Access to Means: No What has been your use of  drugs/alcohol within the last 12 months?: Alcohol use daily for the past 4 months. Previous Attempts/Gestures: No How many times?: 0 Other Self Harm Risks: No other self harm risk identified at this time.  Triggers for Past  Attempts: None known Intentional Self Injurious Behavior: None Family Suicide History: No Recent stressful life event(s):  (No stressful events reported.) Persecutory voices/beliefs?: No Depression: No Depression Symptoms: Tearfulness;Loss of interest in usual pleasures;Isolating Substance abuse history and/or treatment for substance abuse?: Yes Suicide prevention information given to non-admitted patients: Not applicable  Risk to Others within the past 6 months Homicidal Ideation: No Thoughts of Harm to Others: No Current Homicidal Intent: No Current Homicidal Plan: No Access to Homicidal Means: No Identified Victim: NA History of harm to others?: No Assessment of Violence: None Noted Violent Behavior Description: No violent behaviors observed. Pt is calm and cooperative.  Does patient have access to weapons?: No Criminal Charges Pending?: No Does patient have a court date: No  Psychosis Hallucinations: None noted Delusions: None noted  Mental Status Report Appear/Hygiene: In hospital gown Eye Contact: Good Motor Activity: Freedom of movement Speech: Logical/coherent Level of Consciousness: Quiet/awake Mood: Euthymic Affect: Appropriate to circumstance Anxiety Level: None Thought Processes: Coherent;Relevant Judgement: Unimpaired Orientation: Appropriate for developmental age Obsessive Compulsive Thoughts/Behaviors: None  Cognitive Functioning Concentration: Normal Memory: Recent Intact;Remote Intact IQ: Average Insight: Fair Impulse Control: Fair Appetite: Good Weight Loss: 0 Weight Gain: 0 Sleep: No Change Total Hours of Sleep: 5 Vegetative Symptoms: None  ADLScreening [facility] Assessment Services) Patient's cognitive ability adequate to safely complete daily activities?: Yes Patient able to express need for assistance with ADLs?: Yes Independently performs ADLs?: Yes (appropriate for developmental age)  Prior Inpatient Therapy Prior Inpatient Therapy:  Yes Prior Therapy Dates: May 2015 Prior Therapy Facilty/Provider(s): Unknown  Reason for Treatment: detox  Prior Outpatient Therapy Prior Outpatient Therapy: No  ADL Screening (condition at time of admission) Patient's cognitive ability adequate to safely complete daily activities?: Yes Is the patient deaf or have difficulty hearing?: No Does the patient have difficulty seeing, even when wearing glasses/contacts?: No Does the patient have difficulty concentrating, remembering, or making decisions?: No Patient able to express need for assistance with ADLs?: Yes Does the patient have difficulty dressing or bathing?: No Independently performs ADLs?: Yes (appropriate for developmental age)       Abuse/Neglect Assessment (Assessment to be complete while patient is alone) Physical Abuse: Denies Verbal Abuse: Denies Sexual Abuse: Denies Exploitation of patient/patient's resources: Denies Self-Neglect: Denies          Additional Information 1:1 In Past 12 Months?: No CIRT Risk: No Elopement Risk: No Does patient have medical clearance?: No     Disposition:  Disposition Initial Assessment Completed for this Encounter: Yes Disposition of Patient: Inpatient treatment program Type of inpatient treatment program: Adult  Mark Bush S 04/29/2014 10:17 PM

## 2014-04-29 NOTE — ED Notes (Signed)
Clothes removed and placed in scrubs

## 2014-04-29 NOTE — ED Notes (Signed)
TTS at bedside. [facility] aware.

## 2014-04-29 NOTE — BH Assessment (Signed)
Assessment completed with an interpreter. Consulted Maryjean Morn, PA-C who recommended inpatient treatment. TTS will contact other facilities for placement.  [facility] has been notified of the recommendation.

## 2014-04-29 NOTE — ED Notes (Signed)
Pt aware that urine is needed. Unable to urinate at this time.  

## 2014-04-29 NOTE — ED Notes (Signed)
Visitor reports that pt has been drinking all week and this weekend. Pt c/o abdominal pain with N/V. Pt has history of liver problems. Pt needing detox from alcohol.

## 2014-04-29 NOTE — ED Notes (Signed)
Belongings sent home with girlfriend, who was escorted out by security. Pt in scrubs in room, comfortable at this time.

## 2014-04-29 NOTE — ED Provider Notes (Signed)
CSN: [id number]     Arrival date & time 04/29/14  1630 History   First MD Initiated Contact with Patient 04/29/14 1913     Chief Complaint  Patient presents with  . Alcohol Problem  . Abdominal Pain  . Nausea  . Emesis     (Consider location/radiation/quality/duration/timing/severity/associated sxs/prior Treatment) HPI [name] is a 33 year old male with past medical history of liver cirrhosis and previous admission dip her atrial health for alcohol dependence presenting to the ER today requesting assistance with detox from alcohol. Patient states after his admission in May of 2015, he was sober for approximately one month after which he began to drink alcohol on a daily basis again. Patient states that over the past few weeks he has been drinking throughout the day at work, and then at night also. Patient states this past week he has been trying to wean himself from alcohol due to the fact that he has become sick several times with nausea, vomiting and abdominal pain. He states he noticed that when he tried to stop, his nausea and vomiting would become worse he developed tremors, and extreme agitation. He states he would immediately go to the store to buy more beer to help his symptoms. He states after drinking more beer this would help alleviate his symptoms, however he does not wish to continue drinking alcohol like this and would like help with detoxification and with his withdrawal symptoms. Today he complains of some generalized abdominal pain, nausea, vomiting.  Past Medical History  Diagnosis Date  . Liver cirrhosis    History reviewed. No pertinent past surgical history. No family history on file. History  Substance Use Topics  . Smoking status: Never Smoker   . Smokeless tobacco: Not on file  . Alcohol Use: 14.4 oz/week    24 Cans of beer per week     Comment: 24 packs    Review of Systems  Constitutional: Negative for fever.  HENT: Negative for trouble swallowing.    Eyes: Negative for visual disturbance.  Respiratory: Negative for shortness of breath.   Cardiovascular: Negative for chest pain.  Gastrointestinal: Positive for nausea, vomiting and abdominal pain. Negative for diarrhea and blood in stool.  Genitourinary: Negative for dysuria.  Musculoskeletal: Negative for neck pain.  Skin: Negative for rash.  Neurological: Negative for dizziness, weakness and numbness.  Psychiatric/Behavioral: Positive for agitation.       Alcohol dependence      Allergies  Review of patient's allergies indicates no known allergies.  Home Medications   Prior to Admission medications   Not on File   BP 122/69  Pulse 88  Resp 18  Ht  (1.702 m)  Wt 221 lb 8 oz (100.472 kg)  BMI 34.68 kg/m2  SpO2 87% Physical Exam  Constitutional: He is oriented to person, place, and time. He appears well-developed and well-nourished.  Non-toxic appearance. No distress.  Mild diaphoresis noted.  HENT:  Head: Normocephalic and atraumatic.  Mouth/Throat: Oropharynx is clear and moist. No oropharyngeal exudate.  Eyes: Right eye exhibits no discharge. Left eye exhibits no discharge. No scleral icterus.  Neck: Normal range of motion.  Cardiovascular: Normal rate, regular rhythm and normal heart sounds.   No murmur heard. Pulmonary/Chest: Effort normal and breath sounds normal. No respiratory distress.  Abdominal: Soft. Normal appearance and bowel sounds are normal. There is generalized tenderness. There is no rigidity, no guarding, no tenderness at McBurney's point and negative Murphy's sign.  Musculoskeletal: Normal range of motion. He  exhibits no edema and no tenderness.  Neurological: He is alert and oriented to person, place, and time. No cranial nerve deficit. Coordination normal.  Mild tremors of hands noted bilaterally.  Skin: Skin is warm. No rash noted. He is diaphoretic.  Psychiatric: His speech is normal and behavior is normal. Judgment and thought content  normal. His mood appears anxious. Thought content is not paranoid and not delusional. Cognition and memory are normal. He expresses no homicidal and no suicidal ideation. He expresses no suicidal plans and no homicidal plans.    ED Course  Procedures (including critical care time) Labs Review Labs Reviewed  CBC - Abnormal; Notable for the following:    WBC 3.9 (*)    All other components within normal limits  COMPREHENSIVE METABOLIC PANEL - Abnormal; Notable for the following:    BUN 4 (*)    AST 85 (*)    ALT 75 (*)    All other components within normal limits  ETHANOL - Abnormal; Notable for the following:    Alcohol, Ethyl (B) 380 (*)    All other components within normal limits  SALICYLATE LEVEL - Abnormal; Notable for the following:    Salicylate Lvl <2.0 (*)    All other components within normal limits  ACETAMINOPHEN LEVEL  URINE RAPID DRUG SCREEN (HOSP PERFORMED)    Imaging Review No results found.   EKG Interpretation None      MDM   Final diagnoses:  Alcohol dependence with uncomplicated withdrawal   Patient here for alcohol detox and withdrawal symptoms over the past week. Mild tremors noted on exam, with patient's blood alcohol level at 380. He states at this time he is not as bad as he was when he's trying to stop drinking altogether. He states his last drink was this morning. I get the impression that patient is beginning to withdraw at this time, however he is not going through a fulminant withdrawal at this time. Consult placed to TTS, patient placed on psych hold with CIWA protocol enabled. We will treat patient's symptoms while in the ED.   patient's vitals note a SpO2 of 87%. While I was in room with patient, he was 100% on room air with no respiratory distress. Skin was warm and pain with some mild diaphoresis. He is denying any shortness of breath.  10:00 PM: Patient accepted by TTS for admission. TTS to find bed placement.    The patient appears  reasonably stabilized for admission to [facility] considering the current resources, flow, and capabilities available in the ED at this time, and I doubt any other [facility] requiring further screening and/or treatment in the ED prior to admission. Pt moved to Pod C.   BP 122/69  Pulse 88  Resp 18  Ht  (1.702 m)  Wt 221 lb 8 oz (100.472 kg)  BMI 34.68 kg/m2  SpO2 87%   Signed,  Ladona Mow, PA-C 2:28 AM   This patient seen and discussed with Dr. Benjiman Core, M.D.      Monte Fantasia, PA-C 04/30/14 [phone]

## 2014-04-30 ENCOUNTER — Observation Stay (HOSPITAL_COMMUNITY)
Admission: AD | Admit: 2014-04-30 | Discharge: 2014-05-02 | Disposition: A | Discharge status: Home / Self Care | Payer: Self-pay | Source: Intra-hospital | Attending: Nurse Practitioner | Admitting: Nurse Practitioner

## 2014-04-30 ENCOUNTER — Encounter (HOSPITAL_COMMUNITY): Payer: Self-pay | Admitting: Emergency Medicine

## 2014-04-30 ENCOUNTER — Encounter (HOSPITAL_COMMUNITY): Payer: Self-pay | Admitting: *Deleted

## 2014-04-30 DIAGNOSIS — F10230 Alcohol dependence with withdrawal, uncomplicated: Secondary | ICD-10-CM

## 2014-04-30 DIAGNOSIS — F1010 Alcohol abuse, uncomplicated: Secondary | ICD-10-CM | POA: Diagnosis present

## 2014-04-30 DIAGNOSIS — F1020 Alcohol dependence, uncomplicated: Secondary | ICD-10-CM | POA: Diagnosis present

## 2014-04-30 DIAGNOSIS — F10988 Alcohol use, unspecified with other alcohol-induced disorder: Principal | ICD-10-CM | POA: Insufficient documentation

## 2014-04-30 LAB — RAPID URINE DRUG SCREEN, HOSP PERFORMED
Amphetamines: NOT DETECTED
BENZODIAZEPINES: NOT DETECTED
Barbiturates: NOT DETECTED
Cocaine: NOT DETECTED
Opiates: NOT DETECTED
TETRAHYDROCANNABINOL: NOT DETECTED

## 2014-04-30 MED ORDER — ADULT MULTIVITAMIN W/MINERALS CH
1.0000 | ORAL_TABLET | Freq: Every day | ORAL | Status: DC
  Administered 2014-04-30 – 2014-05-02 (×3): 1 via ORAL
  Filled 2014-04-30 (×5): qty 1

## 2014-04-30 MED ORDER — ALUM & MAG HYDROXIDE-SIMETH 200-200-20 MG/5ML PO SUSP: 30.0000 mL | ORAL | Status: DC | PRN

## 2014-04-30 MED ORDER — ONDANSETRON 4 MG PO TBDP
4.0000 mg | ORAL_TABLET | Freq: Four times a day (QID) | ORAL | Status: DC | PRN
  Administered 2014-05-01: 4 mg via ORAL
  Filled 2014-04-30: qty 1

## 2014-04-30 MED ORDER — LOPERAMIDE HCL 2 MG PO CAPS: 2.0000 mg | ORAL_CAPSULE | ORAL | Status: DC | PRN

## 2014-04-30 MED ORDER — ONDANSETRON HCL 4 MG/2ML IJ SOLN
4.0000 mg | Freq: Once | INTRAMUSCULAR | Status: AC
Start: 2014-04-30 — End: 2014-04-30
  Administered 2014-04-30: 4 mg via INTRAVENOUS
  Filled 2014-04-30: qty 2

## 2014-04-30 MED ORDER — LORAZEPAM 1 MG PO TABS: 1.0000 mg | ORAL_TABLET | Freq: Every day | ORAL | Status: DC

## 2014-04-30 MED ORDER — ACETAMINOPHEN 325 MG PO TABS
650.0000 mg | ORAL_TABLET | Freq: Four times a day (QID) | ORAL | Status: DC | PRN
  Administered 2014-05-01: 650 mg via ORAL
  Filled 2014-04-30: qty 2

## 2014-04-30 MED ORDER — VITAMIN B-1 100 MG PO TABS
100.0000 mg | ORAL_TABLET | Freq: Every day | ORAL | Status: DC
  Administered 2014-05-01 – 2014-05-02 (×2): 100 mg via ORAL
  Filled 2014-04-30 (×4): qty 1

## 2014-04-30 MED ORDER — LORAZEPAM 2 MG/ML IJ SOLN: 0.0000 mg | Freq: Four times a day (QID) | INTRAMUSCULAR | Status: DC

## 2014-04-30 MED ORDER — LORAZEPAM 1 MG PO TABS
1.0000 mg | ORAL_TABLET | Freq: Three times a day (TID) | ORAL | Status: DC
  Administered 2014-05-02 (×2): 1 mg via ORAL
  Filled 2014-04-30 (×2): qty 1

## 2014-04-30 MED ORDER — MAGNESIUM HYDROXIDE 400 MG/5ML PO SUSP: 30.0000 mL | Freq: Every day | ORAL | Status: DC | PRN

## 2014-04-30 MED ORDER — HYDROXYZINE HCL 25 MG PO TABS: 25.0000 mg | ORAL_TABLET | Freq: Four times a day (QID) | ORAL | Status: DC | PRN

## 2014-04-30 MED ORDER — SODIUM CHLORIDE 0.9 % IV BOLUS (SEPSIS)
1000.0000 mL | Freq: Once | INTRAVENOUS | Status: AC
  Administered 2014-04-30: 1000 mL via INTRAVENOUS

## 2014-04-30 MED ORDER — THIAMINE HCL 100 MG/ML IJ SOLN
100.0000 mg | Freq: Once | INTRAMUSCULAR | Status: AC
  Administered 2014-04-30: 100 mg via INTRAMUSCULAR
  Filled 2014-04-30: qty 2

## 2014-04-30 MED ORDER — LORAZEPAM 1 MG PO TABS
1.0000 mg | ORAL_TABLET | Freq: Four times a day (QID) | ORAL | Status: AC
  Administered 2014-04-30 – 2014-05-01 (×6): 1 mg via ORAL
  Filled 2014-04-30 (×7): qty 1

## 2014-04-30 MED ORDER — LORAZEPAM 2 MG/ML IJ SOLN: 0.0000 mg | Freq: Two times a day (BID) | INTRAMUSCULAR | Status: DC

## 2014-04-30 MED ORDER — LORAZEPAM 1 MG PO TABS: 1.0000 mg | ORAL_TABLET | Freq: Two times a day (BID) | ORAL | Status: DC

## 2014-04-30 MED ORDER — LORAZEPAM 1 MG PO TABS: 1.0000 mg | ORAL_TABLET | Freq: Four times a day (QID) | ORAL | Status: DC | PRN

## 2014-04-30 MED ORDER — ACETAMINOPHEN 325 MG PO TABS
650.0000 mg | ORAL_TABLET | Freq: Once | ORAL | Status: AC
  Administered 2014-04-30: 650 mg via ORAL
  Filled 2014-04-30: qty 2

## 2014-04-30 NOTE — Progress Notes (Signed)
Note via [employer] at [phone]. Code # [id number]. Patient reported some withdrawal symptoms like mild nausea, no vomiting, mild sweating, headache and stomach pain. Patient refused tylenol for the pain. Accepted Ativan for the withdrawals. Patient denies SI, HI, AH/VH. Due medication given as ordered. Patient became cheerful after spoken with the wife. Support and encouragement offered to the patient. Will continue to monitor the patient.

## 2014-04-30 NOTE — Progress Notes (Signed)
Initial shift Note Observation Pt speaks spanish, interpreter Onalee Hua @ [phone] Denies SI, HI, AVH. Abdominal pain rated at #7. Pt oriented to observation unit. Nourishment offered. Mood is depressed and sad. Pt states he drinks eight beers daily with no drug use. Pt states his stressor is money and his support system is his family. Pt asked what he would like to work on he states, "I don't know". One black cell phone only item in locker #22. Cooperative with skin assessment. Will continue to monitor and stabilize.

## 2014-04-30 NOTE — ED Notes (Signed)
Dr. Jacubowitz at bedside 

## 2014-04-30 NOTE — ED Provider Notes (Signed)
415 pmI interviewed patient using medical interpreter from [employer]. Patient is asymptomatic. He is alert and ambulatory without difficulty. Results for orders placed during the hospital encounter of 04/29/14  ACETAMINOPHEN LEVEL      Result Value Ref Range   Acetaminophen (Tylenol), Serum <15.0  10 - 30 ug/mL  CBC      Result Value Ref Range   WBC 3.9 (*) 4.0 - 10.5 K/uL   RBC 4.75  4.22 - 5.81 MIL/uL   Hemoglobin 14.9  13.0 - 17.0 g/dL   HCT 11.9  14.7 - 82.9 %   MCV 90.9  78.0 - 100.0 fL   MCH 31.4  26.0 - 34.0 pg   MCHC 34.5  30.0 - 36.0 g/dL   RDW 56.2  13.0 - 86.5 %   Platelets 190  150 - 400 K/uL  COMPREHENSIVE METABOLIC PANEL      Result Value Ref Range   Sodium 146  137 - 147 mEq/L   Potassium 4.0  3.7 - 5.3 mEq/L   Chloride 107  96 - 112 mEq/L   CO2 24  19 - 32 mEq/L   Glucose, Bld 96  70 - 99 mg/dL   BUN 4 (*) 6 - 23 mg/dL   Creatinine, Ser 7.84  0.50 - 1.35 mg/dL   Calcium 8.5  8.4 - 69.6 mg/dL   Total Protein 8.0  6.0 - 8.3 g/dL   Albumin 4.3  3.5 - 5.2 g/dL   AST 85 (*) 0 - 37 U/L   ALT 75 (*) 0 - 53 U/L   Alkaline Phosphatase 78  39 - 117 U/L   Total Bilirubin 0.4  0.3 - 1.2 mg/dL   GFR calc non Af Amer >90  >90 mL/min   GFR calc Af Amer >90  >90 mL/min   Anion gap 15  5 - 15  ETHANOL      Result Value Ref Range   Alcohol, Ethyl (B) 380 (*) 0 - 11 mg/dL  SALICYLATE LEVEL      Result Value Ref Range   Salicylate Lvl <2.0 (*) 2.8 - 20.0 mg/dL  URINE RAPID DRUG SCREEN (HOSP PERFORMED)      Result Value Ref Range   Opiates NONE DETECTED  NONE DETECTED   Cocaine NONE DETECTED  NONE DETECTED   Benzodiazepines NONE DETECTED  NONE DETECTED   Amphetamines NONE DETECTED  NONE DETECTED   Tetrahydrocannabinol NONE DETECTED  NONE DETECTED   Barbiturates NONE DETECTED  NONE DETECTED   No results found. Stable for transfer to [facility]  Doug Sou, MD 04/30/14 1624

## 2014-04-30 NOTE — Plan of Care (Signed)
[facility] Observation Crisis Plan  Reason for Crisis Plan:  Substance Abuse   Plan of Care:  Referral for Substance Abuse  Family Support:      Current Living Environment:     Insurance:   Hospital Account   Name Acct ID Class Status Primary Coverage   Z[place], Ta [id number] BEHAVIORAL HEALTH OBSERVATION Open None        Guarantor Account (for Hospital Account [id number])   Name Relation to Pt Service Area Active? Acct Type   [name] Self [facility] Yes Behavioral Health   Address Phone       [address] [place], [place] [address] [phone](H) [phone])          Coverage Information (for Hospital Account [id number])   Not on file      Legal Guardian:     Primary Care Provider:  No PCP Per Patient  Current Outpatient Providers:  unknown  Psychiatrist:     Counselor/Therapist:     Compliant with Medications:  No  Additional Information:   Loren Racer 9/13/20156:17 PM

## 2014-04-30 NOTE — ED Notes (Signed)
Pt continues to c/o headache and abdominal pain.  Pt did not eat breakfast d/t nausea.  CIWA 7.  Dr Ethelda Chick notified.

## 2014-04-30 NOTE — Discharge Instructions (Signed)
Cmo buscar tratamiento para la adiccin al alcohol y a las drogas  (Finding Treatment for Alcohol and Drug Addiction) Puede ser difcil encontrar el lugar adecuado para recibir Ambulance person. Aqu le indicamos algunos factores importantes para que tenga en cuenta.   Hay diferentes tipos de tratamiento para elegir.  Algunos programas se realizan con internacin (en hogares especiales) y otros no (como paciente ambulatorio). En algunos casos se ofrece una combinacin de [facility].  No hay un nico tipo de programa que sea adecuado para todos.  La mayor parte de los programas de tratamiento incluyen una combinacin de psicoterapia, educacin y un abordaje de 12 pasos basado en la espiritualidad.  Hay programas que no estn basados en la espiritualidad (no tienen 12 pasos).  Algunos programas de tratamiento estn patrocinados por el gobierno. Estn dirigidos a pacientes que no tienen seguros privados.  Los programas de tratamiento pueden variar en muchos aspectos, como:  Costos y tipos de seguro aceptados.  Tipos de servicios mdicos ofrecidos en el lugar.  Duracin de Engineer, water, tipo y Writer del establecimiento.  Filosofa general del tratamiento. [place] persona puede necesitar un tratamiento especializado o tener necesidades que no estn contempladas en todos los programas. Por ejemplo, los adolescentes necesitan un tratamiento apropiado para su edad. Otras personas tienen trastornos secundarios que tambin deben ser tratados. Las enfermedades secundarias pueden ser enfermedades mentales, como la depresin, u otras enfermedades como la diabetes. Generalmente es necesario pasar por un perodo de desintoxicacin del alcohol o las drogas. Esto requiere supervisin mdica y no todos los programas pueden [address]  QU SE DEBE CONSIDERAR ANTES DE SELECCIONAR UN PROGRAMA DE TRATAMIENTO:   El programa est certificado por la agencia de gobierno correspondiente? An  los programas privados deben estar certificados y emplear profesionales certificados.  El programa est aceptado por su seguro mdico? En caso contrario, puede establecerse un plan de pagos?  Las instalaciones, estn limpias, organizadas y bien administradas? Le permiten hablar con personas que hayan obtenido el alta mdica y puedan compartir su experiencia con usted? Puede visitar las instalaciones? Puede conocer al personal?  El programa contempla todos los aspectos de las necesidades individuales?  El programa de tratamiento tiene en cuenta la orientacin sexual y las discapacidades fsicas? Ofrecen servicios adecuados para las diferencias de [place], gnero y cultura?  Es un tratamiento disponible en otros idiomas que no sea ingls?  Se alienta o proporciona un apoyo o gua de cuidados posteriores a largo plazo?  Se realiza una valoracin continua de el plan de tratamiento del individuo para asegurarse que conoce sus necesidades de cambio?  El programa utiliza estrategias para Air cabin crew a los pacientes reacios a que puedan seguir un tratamiento de duracin suficiente que aumente la probabilidad de xito?  El programa ofrece psicoterapia (individual o grupal) u otras terapias conductuales?  Ofrece el programa medicacin como parte del rgimen de [place], si fuera necesario?  Hay un control continuo de las posibles recadas? Tienen definido un programa de prevencin de recadas? Se ofrecen servicios a los miembros de la familia para asegurar que comprenden la adiccin y el proceso de recuperacin? Esto ayudara a Biomedical scientist individual.  Los encuentros en 12 pasos se llevan a cabo en el centro, o hay un transporte disponible para los pacientes que deban asistir a reuniones externas? En pases fuera de los [address] y [place], vea las guas locales para obtener la informacin de contacto de los servicios para cada rea.  Document Released: 07/17/2008  Document Revised: 10/27/2011  ExitCare Patient Information 2015 [place], [place]. This information is not intended to replace advice given to you by your health care provider. Make sure you discuss any questions you have with your health care provider.  Trastorno por abuso de alcohol (Alcohol Use Disorder) El trastorno por abuso de alcohol es un trastorno mental. No se trata de un incidente nico en el que se bebe en abundancia. Este trastorno se debe al consumo incontrolable del alcohol a lo largo del [place], que causa dificultades en una o ms reas de la vida diaria. Al consumir en exceso, las personas que sufren este trastorno estn en riesgo de daarse a s mismos y a Dentist. Tambin puede causar otros trastornos [place], como trastornos del [place] de nimo y trastornos de [place], y problemas fsicos graves. Las personas que sufren trastornos por consumo de alcohol, generalmente abusan de otras sustancias.  Los trastornos por consumo de alcohol son frecuentes y se han generalizado. Algunas personas beben alcohol para poder enfrentarse o escapar a las situaciones negativas de la vida. Otros beben para Government social research officer crnico o los sntomas de una enfermedad mental. Las personas con historia familiar de trastornos por consumo de alcohol tienen ms riesgo de perder el control y de consumir alcohol en exceso.  SNTOMAS  Los signos y sntomas pueden ser:   Consumo dealcohol engrandes cantidades o durante perodos ms prolongados que lo previsto.  Mltiples intentos fallidos de dejar de bebero controlar el consumo de alcohol.   Emplear gran cantidad de tiempo para obtener, consumir o recuperarse de los efectos del alcohol (resaca).  Un deseo intenso o necesidad de consumir alcohol (abstinencia).   Continuar consumiendo alcohol a pesar de los [employer], en la escuela o en el hogar debido al consumo.   Continuar consumiendo a Scientist, water quality de [employer] de relacin debido al  consumo.  Continuar consumiendo an en situaciones que impliquen un peligro fsico, como al conducir un vehculo.  Continuar consumiendo a pesar de saber que un problema fsico o psicolgico probablemente se relaciones con el consumo de alcohol. Los problemas fsicos relacionados con el consumo de alcohol pueden producirse en el cerebro, el corazn, el hgado, el estmago y los intestinos. Los problemas psicolgicos relacionados con el consumo de alcohol incluyen intoxicacin, depresin, ansiedad, psicosis, delirio y Photographer.   La necesidad de aumentar la cantidad de alcohol para Wellsite geologist deseado, o una disminucin del efecto al consumir la misma cantidad de alcohol (tolerancia).  Sndrome de abstinencia al reducir o Photographer consumo, o tener que consumir alcohol para reducir o Multimedia programmer sndrome de abstinencia. Los sntomas de la abstinencia incluyen:  Frecuencia cardaca acelerada.  Temblores en las manos.  Dificultad para dormir.  Nuseas.  Vmitos.  Alucinaciones.  Agitacin.  Convulsiones. DIAGNSTICO  El mdico es el encargado de diagnosticar el trastorno por consumo de alcohol. Comenzar hacindole tres o cuatro preguntas para evaluar si consume alcohol en forma excesiva o si representa un problema. Para confirmar el diagnstico de trastorno por consumo de alcohol, deben estar presentes al [employer] un perodo de 12 meses. La gravedad del trastorno depende del nmero de sntomas:   Leve--dos o tres.  Moderado--cuatro o cinco.  Grave--seis o ms. El mdico podr Sports coach un examen fsico o [employer] los [place] de las pruebas de laboratorio para ver si usted tiene problemas fsicos como resultado del consumo de alcohol. Podr derivarlo a Administrator, sports de la salud mental para que realice una evaluacin.  TRATAMIENTO  Algunas personas podrn reducir el consumo a [employer] que haya un bajo riesgo. Otras personas deben dejar de beber. Cuando  sea necesario, podr recibir ayuda de los profesionales de la salud mental, capacitados en el tratamiento del abuso de sustancias. El mdico podr informarlo sobre la gravedad de su trastorno por consumo de alcohol y decidir qu tipo de tratamiento necesita. Las formas de tratamiento disponibles son:   Desintoxicacin. La desintoxicacin consiste en el uso de medicamentos recetados para evitar el sndrome de abstinencia durante la primera semana en la que se deja de consumir. Esto es importante para las personas con historia de sndrome de abstinencia y para los que consumen en exceso, quienes probablemente sufran el sndrome de abstinencia. El sndrome de abstinencia puede ser peligroso y en algunos casos puede causar la [place]. La desintoxicacin generalmente se realiza internado en un hospital o en una institucin para pacientes que abusan de sustancias.  Asesoramiento psicolgico o psicoterapia. La psicoterapia la realizan terapeutas especializados en el tratamiento de pacientes que abusan de sustancias. Se evalan los motivos que llevan a consumir alcohol, y los modos para Furniture conservator/restorer a consumir. Los [employer] de la psicoterapia son ayudar a que las personas con trastornos por consumo de alcohol encuentren actividades saludables y Standard Pacific de Radio broadcast assistant estrs de la vida, a identificar y Programmer, applications disparadores que originan el consumo de alcohol y a Scientist, physiological abstinencia, que puede originar una recada.  Medicamentos.Hay diferentes medicamentos que pueden ayudar a tratar el trastorno por consumo de alcohol a travs de los siguientes efectos:  Controlan la ansiedad por consumir.  Disminuyen la respuesta de recompensa positiva que se siente al consumir.  Causan una reaccin fsica desagradable cuando se consume alcohol (terapia por aversin).  Grupos de apoyo. Los grupos de apoyo son dirigidos por personas que han dejado de consumir. Ellos brindan apoyo emocional, consejo y [place]. Estas formas  de tratamiento generalmente se combinan. Algunas personas se benefician con el tratamiento combinado intensivo que se ofrece en algunos centros de tratamiento especializados en el abuso de sustancias. Existen programas para pacientes hospitalizados o ambulatorios.  Document Released: 08/04/2005 Document Revised: 12/19/2013 [facility] Patient Information 2015 [place], [place]. This information is not intended to replace advice given to you by your health care provider. Make sure you discuss any questions you have with your health care provider.  Abstinencia alcohlica (Alcohol Withdrawal) Si usted consume alcohol con frecuencia y de repente deja de beber, es posible que sufra por la abstinencia de alcohol. Los sntomas pueden ser desde leves a muy intensos. Los sntomas leves pueden ser sentirse mal del estmago (sentir nuseas), tener dolor de cabeza o estar irritable. Los sntomas de abstinencia graves pueden incluir temblores, nerviosismo (ansiedad), y falta de claridad para pensar.  CUIDADOS EN EL HOGAR  nase a un grupo de apoyo para alcohlicos.  Mantngase alejado de personas o situaciones que le den ganas de beber.  Consuma una dieta saludable. Coma mucha fruta fresca, verduras y carnes magras. SOLICITE AYUDA DE INMEDIATO SI:   Se siente confundido. Comienza a ver y or cosas que no existen.  Siente que su corazn late [place] rpido.  Vomita sangre o no puede dejar de vomitar. Puede tener vmitos con sangre de color rojo brillante o similar al sedimento del caf negro.  Observa sangre en la materia fecal. Puede ser de color rojo brillante, color granate, o negro y alquitranado.  Est mareado o pierde el conocimiento (se desmaya).  Le sube la fiebre. ASEGRESE DE QUE:  Comprende estas instrucciones.  Controlar su enfermedad.  Solicitar ayuda de inmediato si no mejora o empeora. Document Released: 09/06/2010 Document Revised: 10/27/2011 [facility] Patient Information 2015  [place], [place]. This information is not intended to replace advice given to you by your health care provider. Make sure you discuss any questions you have with your health care provider.  Abstinencia alcohlica (Alcohol Withdrawal) Si usted consume alcohol con frecuencia y de repente deja de beber, es posible que sufra por la abstinencia de alcohol. Los sntomas pueden ser desde leves a muy intensos. Los sntomas leves pueden ser sentirse mal del estmago (sentir nuseas), tener dolor de cabeza o estar irritable. Los sntomas de abstinencia graves pueden incluir temblores, nerviosismo (ansiedad), y falta de claridad para pensar.  CUIDADOS EN EL HOGAR  nase a un grupo de apoyo para alcohlicos.  Mantngase alejado de personas o situaciones que le den ganas de beber.  Consuma una dieta saludable. Coma mucha fruta fresca, verduras y carnes magras. SOLICITE AYUDA DE INMEDIATO SI:   Se siente confundido. Comienza a ver y or cosas que no existen.  Siente que su corazn late [place] rpido.  Vomita sangre o no puede dejar de vomitar. Puede tener vmitos con sangre de color rojo brillante o similar al sedimento del caf negro.  Observa sangre en la materia fecal. Puede ser de color rojo brillante, color granate, o negro y alquitranado.  Est mareado o pierde el conocimiento (se desmaya).  Le sube la fiebre. ASEGRESE DE QUE:   Comprende estas instrucciones.  Controlar su enfermedad.  Solicitar ayuda de inmediato si no mejora o empeora. Document Released: 09/06/2010 Document Revised: 10/27/2011 [facility] Patient Information 2015 [place], [place]. This information is not intended to replace advice given to you by your health care provider. Make sure you discuss any questions you have with your health care provider.

## 2014-04-30 NOTE — ED Provider Notes (Signed)
Medical screening examination/treatment/procedure(s) were performed by non-physician practitioner and as supervising physician I was immediately available for consultation/collaboration.   EKG Interpretation None       Juliet Rude. Rubin Payor, MD 04/30/14 [phone]

## 2014-04-30 NOTE — Progress Notes (Addendum)
Pt's referral faxed to [facility] and [facility].  Pt declined at [facility] per Joni Reining d/t not having an interpreter.  [facility] at capacity per [facility].  *[phone] per Tionne at [facility] currently at capacity, asked that [facility] try back tomorrow for available beds.   Tomi Bamberger Disposition MHT

## 2014-04-30 NOTE — Progress Notes (Signed)
[facility] INPATIENT:  Family/Significant Other Suicide Prevention Education  Suicide Prevention Education:  Patient Refusal for Family/Significant Other Suicide Prevention Education: The patient Mark Bush has refused to provide written consent for family/significant other to be provided Family/Significant Other Suicide Prevention Education during admission and/or prior to discharge.  Physician notified.  Loren Racer 04/30/2014, 6:21 PM

## 2014-04-30 NOTE — Progress Notes (Signed)
Case Manager spoke with patient via the Spanish Interpreting service.Role of CM explained.Patient verbalizes his understanding.Patient reports he does not have health Insurance or a PCP Education provided to patient that the [facility] ( [place] [facility]) accepts patients without Insurance.Patient educated on the Importance of establishing care with a PCP.[facility] services explained to patient.CM explained to patient with his consent this CM can e-mail the [facility] and assist with setting up his PCP appointment and social worker appointment. Patient evaluation continues in the ED.CM will provide resource sheet for the [facility]  And a [employer].No further CM needs at this time.

## 2014-04-30 NOTE — ED Notes (Addendum)
Pt states girlfriend took all belongings, even Consulting civil engineer.  Pt only has black cell phone at bedside.  Her name is Mark Bush and her # is [phone].  Pt is aware through translator phone that no visitors are allowed at [facility].

## 2014-05-01 DIAGNOSIS — F1994 Other psychoactive substance use, unspecified with psychoactive substance-induced mood disorder: Secondary | ICD-10-CM

## 2014-05-01 DIAGNOSIS — F1910 Other psychoactive substance abuse, uncomplicated: Secondary | ICD-10-CM

## 2014-05-01 NOTE — Progress Notes (Signed)
Pt resting comfortably in bed.  Pt sleeping occasionally.  Pt cooperative and pleasant.  Pt denies pain at this time.  Pt affect is flat and mood is sad.  Will continue to monitor.

## 2014-05-01 NOTE — H&P (Signed)
Psychiatric Admission Assessment Adult  Patient Identification:  Mark Bush Date of Evaluation:  05/01/2014 Chief Complaint:  ALCOHOL ABUSE History of Present Illness:: 33 Y/O male who states he came as needed to be detox. This time around he has been drinking for the last 15 days, every day, more than 24 beers per day with some liquor. He starts drinking in the morning to stop the sweats and the "shakes." He states that he has managed to continue working. Does admit he is feeling weak, not able to eat. Worried as he has not been able to stop on his own. Cant identify any particular trigger other than he likes to drink. He drinks out of the house. The drinking is causing friction with his wife. He is alo afraid is going to cost him his job if does not quit  Associated Signs/Synptoms: Depression Symptoms:  depressed mood, insomnia, fatigue, disturbed sleep, decreased appetite, mainly when he drinks (Hypo) Manic Symptoms:  none Anxiety Symptoms:  Excessive Worry, Psychotic Symptoms:  denies PTSD Symptoms: Negative Total Time spent with patient: 30 minutes  Psychiatric Specialty Exam: Physical Exam  Review of Systems  Constitutional: Positive for weight loss, malaise/fatigue and diaphoresis.  Respiratory: Negative.   Cardiovascular: Negative.   Gastrointestinal: Positive for nausea and vomiting.  Genitourinary: Negative.   Musculoskeletal: Negative.   Skin: Negative.   Neurological: Positive for tremors, weakness and headaches.  Endo/Heme/Allergies: Negative.   Psychiatric/Behavioral: Positive for substance abuse. The patient is nervous/anxious and has insomnia.     Blood pressure 127/90, pulse 92, temperature 98.4 F (36.9 C), temperature source Oral, resp. rate 18, height _0  (1.702 m), weight 99.791 kg (220 lb), SpO2 100.00%.Body mass index is 34.45 kg/(m^2).  General Appearance: Disheveled and sweating  Eye Contact::  Fair  Speech:  Clear and Coherent  Volume:   Decreased  Mood:  Anxious and worried  Affect:  anxious, worried  Thought Process:  Coherent and Goal Directed  Orientation:  Full (Time, Place, and Person)  Thought Content:  events, symptoms worries concerns  Suicidal Thoughts:  No  Homicidal Thoughts:  No  Memory:  Immediate;   Fair Recent;   Fair Remote;   Fair  Judgement:  Fair  Insight:  Present  Psychomotor Activity:  Restlessness  Concentration:  Fair  Recall:  [employer] of Knowledge:NA  Language: Fair  Akathisia:  No  Handed:    AIMS (if indicated):     Assets:  Desire for Improvement Housing Vocational/Educational  Sleep:       Musculoskeletal: Strength & Muscle Tone: within normal limits Gait & Station: normal Patient leans: N/A  Past Psychiatric History: Diagnosis:  Hospitalizations: Detox at [facility] no further follow up  Outpatient Care:Denies  Substance Abuse Care:Denies  Self-Mutilation:Denies  Suicidal Attempts:Denies  Violent Behaviors:Denies   Past Medical History:   Past Medical History  Diagnosis Date  . Liver cirrhosis     Allergies:  No Known Allergies PTA Medications: No prescriptions prior to admission    Previous Psychotropic Medications:  Medication/Dose    None             Substance Abuse History in the last 12 months:  Yes.    Consequences of Substance Abuse: Medical Consequences:  fatty liver  Social History:  reports that he has never smoked. He does not have any smokeless tobacco history on file. He reports that he drinks about 14.4 ounces of alcohol per week. He reports that he does not use illicit drugs. Additional Social  History: History of alcohol / drug use?: Yes Longest period of sobriety (when/how long): 3 mos Negative Consequences of Use: Personal relationships Withdrawal Symptoms: Nausea / Vomiting Name of Substance 1: Alcohol  1 - Age of First Use: 20  1 - Amount (size/oz): "9-240z" 1 - Frequency: daily  1 - Duration: 4 months  1 - Last Use /  Amount: 04-29-14 "not much"                  Current Place of Residence:  Lives with wife, kids and couple of other people Place of Birth:   Family Members: Marital Status:  Married Children:  Sons:  Daughters:, 3,4 Y/O Relationships: Education:  N/A Educational Problems/Performance: Religious Beliefs/Practices: History of Abuse (Emotional/Phsycial/Sexual) Occupational Experiences;Adult nurse History:  None. Legal History:  Hobbies/Interests:  Family History:  History reviewed. No pertinent family history.  Results for orders placed during the hospital encounter of 04/29/14 (from the past 72 hour(s))  ACETAMINOPHEN LEVEL     Status: None   Collection Time    04/29/14  4:49 PM      Result Value Ref Range   Acetaminophen (Tylenol), Serum <15.0  10 - 30 ug/mL   Comment:            THERAPEUTIC CONCENTRATIONS VARY     SIGNIFICANTLY. A RANGE OF 10-30     ug/mL MAY BE AN EFFECTIVE     CONCENTRATION FOR MANY PATIENTS.     HOWEVER, SOME ARE BEST TREATED     AT CONCENTRATIONS OUTSIDE THIS     RANGE.     ACETAMINOPHEN CONCENTRATIONS     >150 ug/mL AT 4 HOURS AFTER     INGESTION AND >50 ug/mL AT 12     HOURS AFTER INGESTION ARE     OFTEN ASSOCIATED WITH TOXIC     REACTIONS.  CBC     Status: Abnormal   Collection Time    04/29/14  4:49 PM      Result Value Ref Range   WBC 3.9 (*) 4.0 - 10.5 K/uL   RBC 4.75  4.22 - 5.81 MIL/uL   Hemoglobin 14.9  13.0 - 17.0 g/dL   HCT 43.2  39.0 - 52.0 %   MCV 90.9  78.0 - 100.0 fL   MCH 31.4  26.0 - 34.0 pg   MCHC 34.5  30.0 - 36.0 g/dL   RDW 12.8  11.5 - 15.5 %   Platelets 190  150 - 400 K/uL  COMPREHENSIVE METABOLIC PANEL     Status: Abnormal   Collection Time    04/29/14  4:49 PM      Result Value Ref Range   Sodium 146  137 - 147 mEq/L   Potassium 4.0  3.7 - 5.3 mEq/L   Chloride 107  96 - 112 mEq/L   CO2 24  19 - 32 mEq/L   Glucose, Bld 96  70 - 99 mg/dL   BUN 4 (*) 6 - 23 mg/dL   Creatinine, Ser 0.64   0.50 - 1.35 mg/dL   Calcium 8.5  8.4 - 10.5 mg/dL   Total Protein 8.0  6.0 - 8.3 g/dL   Albumin 4.3  3.5 - 5.2 g/dL   AST 85 (*) 0 - 37 U/L   ALT 75 (*) 0 - 53 U/L   Alkaline Phosphatase 78  39 - 117 U/L   Total Bilirubin 0.4  0.3 - 1.2 mg/dL   GFR calc non Af Amer >90  >90 mL/min  GFR calc Af Amer >90  >90 mL/min   Comment: (NOTE)     The eGFR has been calculated using the CKD EPI equation.     This calculation has not been validated in all clinical situations.     eGFR's persistently <90 mL/min signify possible Chronic Kidney     Disease.   Anion gap 15  5 - 15  ETHANOL     Status: Abnormal   Collection Time    04/29/14  4:49 PM      Result Value Ref Range   Alcohol, Ethyl (B) 380 (*) 0 - 11 mg/dL   Comment:            LOWEST DETECTABLE LIMIT FOR     SERUM ALCOHOL IS 11 mg/dL     FOR MEDICAL PURPOSES ONLY  SALICYLATE LEVEL     Status: Abnormal   Collection Time    04/29/14  4:49 PM      Result Value Ref Range   Salicylate Lvl <1.7 (*) 2.8 - 20.0 mg/dL  URINE RAPID DRUG SCREEN (HOSP PERFORMED)     Status: None   Collection Time    04/30/14 12:45 PM      Result Value Ref Range   Opiates NONE DETECTED  NONE DETECTED   Cocaine NONE DETECTED  NONE DETECTED   Benzodiazepines NONE DETECTED  NONE DETECTED   Amphetamines NONE DETECTED  NONE DETECTED   Tetrahydrocannabinol NONE DETECTED  NONE DETECTED   Barbiturates NONE DETECTED  NONE DETECTED   Comment:            DRUG SCREEN FOR MEDICAL PURPOSES     ONLY.  IF CONFIRMATION IS NEEDED     FOR ANY PURPOSE, NOTIFY LAB     WITHIN 5 DAYS.                LOWEST DETECTABLE LIMITS     FOR URINE DRUG SCREEN     Drug Class       Cutoff (ng/mL)     Amphetamine      1000     Barbiturate      200     Benzodiazepine   915     Tricyclics       056     Opiates          300     Cocaine          300     THC              50   Psychological Evaluations:  Assessment:   DSM5:  Substance/Addictive Disorders:  Alcohol Related Disorder  - Severe (303.90), Alcohol withdrawal Depressive Disorders:  Major Depressive Disorder - Moderate (296.22)  AXIS I:  Substance Induced Mood Disorder AXIS II:  No diagnosis AXIS III:   Past Medical History  Diagnosis Date  . Liver cirrhosis    AXIS IV:  other psychosocial or environmental problems AXIS V:  41-50 serious symptoms  Treatment Plan/Recommendations:  Supportive approach/coping skills/relapse prevention                                                                 Continue detox protocol  Treatment Plan Summary: Daily contact with patient to assess and evaluate symptoms and progress  in treatment Medication management Current Medications:  Current Facility-Administered Medications  Medication Dose Route Frequency Provider Last Rate Last Dose  . acetaminophen (TYLENOL) tablet 650 mg  650 mg Oral Q6H PRN Benjamine Mola, FNP      . alum & mag hydroxide-simeth (MAALOX/MYLANTA) 200-200-20 MG/5ML suspension 30 mL  30 mL Oral Q4H PRN Benjamine Mola, FNP      . hydrOXYzine (ATARAX/VISTARIL) tablet 25 mg  25 mg Oral Q6H PRN Benjamine Mola, FNP      . loperamide (IMODIUM) capsule 2-4 mg  2-4 mg Oral PRN Benjamine Mola, FNP      . LORazepam (ATIVAN) injection 0-4 mg  0-4 mg Intravenous 4 times per day Delfin Gant, NP       Followed by  . [START ON 05/02/2014] LORazepam (ATIVAN) injection 0-4 mg  0-4 mg Intravenous Q12H Delfin Gant, NP      . LORazepam (ATIVAN) tablet 1 mg  1 mg Oral Q6H PRN Benjamine Mola, FNP      . LORazepam (ATIVAN) tablet 1 mg  1 mg Oral QID Benjamine Mola, FNP   1 mg at 05/01/14 1216   Followed by  . [START ON 05/02/2014] LORazepam (ATIVAN) tablet 1 mg  1 mg Oral TID Benjamine Mola, FNP       Followed by  . [START ON 05/03/2014] LORazepam (ATIVAN) tablet 1 mg  1 mg Oral BID Benjamine Mola, FNP       Followed by  . [START ON 05/04/2014] LORazepam (ATIVAN) tablet 1 mg  1 mg Oral Daily John C Withrow, FNP      . magnesium hydroxide (MILK OF MAGNESIA)  suspension 30 mL  30 mL Oral Daily PRN Benjamine Mola, FNP      . multivitamin with minerals tablet 1 tablet  1 tablet Oral Daily Benjamine Mola, FNP   1 tablet at 05/01/14 0857  . ondansetron (ZOFRAN-ODT) disintegrating tablet 4 mg  4 mg Oral Q6H PRN Benjamine Mola, FNP      . thiamine (VITAMIN B-1) tablet 100 mg  100 mg Oral Daily Benjamine Mola, FNP   100 mg at 05/01/14 1216    Observation Level/Precautions:  15 minute checks  Laboratory:  As per the ED  Psychotherapy:    Medications:    Consultations:    Discharge Concerns:  Need for rehab  Estimated LOS: OBS bed  Other:      Mark Bush A 9/14/20152:31 PM

## 2014-05-01 NOTE — Progress Notes (Signed)
Pt alert, oriented and cooperative. Denies SI/HI, -A/[place]. Pt c/o stomach cramps and nausea see MAR. Minimal interaction with staff and peers due to language barrier. Frequent assessment of needs with language translation. Will continue to monitor closely and evaluate for stabilization.

## 2014-05-02 DIAGNOSIS — F1010 Alcohol abuse, uncomplicated: Secondary | ICD-10-CM

## 2014-05-02 NOTE — Plan of Care (Signed)
[facility] Observation Crisis Plan  Reason for Crisis Plan:  Substance Abuse   Plan of Care:  Referral for Substance Abuse  Family Support:    None  Current Living Environment:  Living Arrangements: Other relatives; Pt has been living with friends, but pt does not believe that he can return to their household.  Insurance:  Self Pay Hospital Account   Name Acct ID Class Status Primary Coverage   Mark Bush, Mark Bush [id number] BEHAVIORAL HEALTH OBSERVATION Open None        Guarantor Account (for Hospital Account [id number])   Name Relation to Pt Service Area Active? Acct Type   Mark Bush Self [facility] Yes Behavioral Health   Address Phone       [address] [place], [place] [address] [phone](H) [phone])          Coverage Information (for Hospital Account [id number])   Not on file      Legal Guardian:   Self  Primary Care Provider:  No PCP Per Patient  Current Outpatient Providers:  None  Psychiatrist:   None  Counselor/Therapist:   None  Compliant with Medications:  Yes; pt is not on any medications  Additional Information: After consulting with Mark Lyons, MD it has been determined that pt does not present a life threatening danger to himself or others, and that psychiatric hospitalization is not indicated for him at this time.  He would, however, benefit from outpatient substance abuse treatment.  This Clinical research associate had an interpreter by the name of Mark Bush present to discuss disposition with the pt.  Pt was given the option of having Observation Unit staff contact [facility] to schedule an intake appointment, but would prefer to contact them at his own initiative.  Contact information for [employer], including the names of two specific clinicians that work with Spanish speakers, has been included in pt's discharge instructions.  Mark Canning, MA Triage Specialist Mark Bush Mark Bush 9/15/20151:24 PM

## 2014-05-02 NOTE — Discharge Summary (Signed)
[facility] UNIT DISCHARGE SUMMARY  Patient Identification:  Mark Bush Date of Evaluation:  05/02/2014 Chief Complaint:  ALCOHOL ABUSE  Subjective: Pt seen and chart reviewed. Pt denies SI, HI, and AVH, contracts for safety. Pt is improving and his CIWA scores are consistently under 5. Pt is stable enough to discharge and will be sent home with outpatient resources.   History of Present Illness:: 33 Y/O male who states he came as needed to be detox. This time around he has been drinking for the last 15 days, every day, more than 24 beers per day with some liquor. He starts drinking in the morning to stop the sweats and the "shakes." He states that he has managed to continue working. Does admit he is feeling weak, not able to eat. Worried as he has not been able to stop on his own. Cant identify any particular trigger other than he likes to drink. He drinks out of the house. The drinking is causing friction with his wife. He is alo afraid is going to cost him his job if does not quit  Associated Signs/Synptoms: Depression Symptoms:  depressed mood, insomnia, fatigue, disturbed sleep, decreased appetite, mainly when he drinks (Hypo) Manic Symptoms:  none Anxiety Symptoms:  Excessive Worry, Psychotic Symptoms:  denies PTSD Symptoms: Negative Total Time spent with patient: 30 minutes  Psychiatric Specialty Exam: Physical Exam  Review of Systems  Constitutional: Positive for weight loss.  HENT: Negative.   Eyes: Negative.   Respiratory: Negative.   Cardiovascular: Negative.   Gastrointestinal: Negative.   Genitourinary: Negative.   Musculoskeletal: Negative.   Skin: Negative.   Neurological: Positive for weakness.  Endo/Heme/Allergies: Negative.   Psychiatric/Behavioral: Positive for depression and substance abuse. The patient is nervous/anxious and has insomnia.     Blood pressure 127/80, pulse 90, temperature 98.4 F (36.9 C), temperature source Oral, resp. rate 16, height  5' 7"  (1.702 m), weight 99.791 kg (220 lb), SpO2 100.00%.Body mass index is 34.45 kg/(m^2).   General Appearance: Disheveled   Eye Sport and exercise psychologist:: Fair   Speech: Clear and Coherent   Volume: Normal   Mood: Euthymic   Affect: Appropriate   Thought Process: Coherent and Goal Directed   Orientation: Full (Time, Place, and Person)   Thought Content: symptoms, readiness for D/C , Relapse prevention plan   Suicidal Thoughts: No   Homicidal Thoughts: No   Memory: Immediate; Fair  Recent; Fair  Remote; Fair   Judgement: Fair   Insight: Present   Psychomotor Activity: Normal   Concentration: Fair   Recall: [employer] of Knowledge:NA   Language: Fair   Akathisia: No   Handed:   AIMS (if indicated):   Assets: Desire for Improvement  Housing  Social Support  Vocational/Educational   Sleep:    Musculoskeletal:  Strength & Muscle Tone: within normal limits  Gait & Station: normal  Patient leans: N/A   Past Psychiatric History: Diagnosis:  Hospitalizations: Detox at [facility] no further follow up  Outpatient Care:Denies  Substance Abuse Care:Denies  Self-Mutilation:Denies  Suicidal Attempts:Denies  Violent Behaviors:Denies   Past Medical History:   Past Medical History  Diagnosis Date  . Liver cirrhosis     Allergies:  No Known Allergies PTA Medications: No prescriptions prior to admission    Previous Psychotropic Medications:  Medication/Dose    None             Substance Abuse History in the last 12 months:  Yes.    Consequences of Substance Abuse: Medical  Consequences:  fatty liver  Social History:  reports that he has never smoked. He does not have any smokeless tobacco history on file. He reports that he drinks about 14.4 ounces of alcohol per week. He reports that he does not use illicit drugs. Additional Social History: History of alcohol / drug use?: Yes Longest period of sobriety (when/how long): 3 mos Negative Consequences of Use: Personal  relationships Withdrawal Symptoms: Nausea / Vomiting Name of Substance 1: Alcohol  1 - Age of First Use: 20  1 - Amount (size/oz): "9-240z" 1 - Frequency: daily  1 - Duration: 4 months  1 - Last Use / Amount: 04-29-14 "not much"                  Current Place of Residence:  Lives with wife, kids and couple of other people Place of Birth:   Family Members: Marital Status:  Married Children:  Sons:  Daughters:, 3,4 Y/O Relationships: Education:  N/A Educational Problems/Performance: Religious Beliefs/Practices: History of Abuse (Emotional/Phsycial/Sexual) Occupational Experiences;Adult nurse History:  None. Legal History:  Hobbies/Interests:  Family History:  History reviewed. No pertinent family history.  Results for orders placed during the hospital encounter of 04/29/14 (from the past 72 hour(s))  ACETAMINOPHEN LEVEL     Status: None   Collection Time    04/29/14  4:49 PM      Result Value Ref Range   Acetaminophen (Tylenol), Serum <15.0  10 - 30 ug/mL   Comment:            THERAPEUTIC CONCENTRATIONS VARY     SIGNIFICANTLY. A RANGE OF 10-30     ug/mL MAY BE AN EFFECTIVE     CONCENTRATION FOR MANY PATIENTS.     HOWEVER, SOME ARE BEST TREATED     AT CONCENTRATIONS OUTSIDE THIS     RANGE.     ACETAMINOPHEN CONCENTRATIONS     >150 ug/mL AT 4 HOURS AFTER     INGESTION AND >50 ug/mL AT 12     HOURS AFTER INGESTION ARE     OFTEN ASSOCIATED WITH TOXIC     REACTIONS.  CBC     Status: Abnormal   Collection Time    04/29/14  4:49 PM      Result Value Ref Range   WBC 3.9 (*) 4.0 - 10.5 K/uL   RBC 4.75  4.22 - 5.81 MIL/uL   Hemoglobin 14.9  13.0 - 17.0 g/dL   HCT 43.2  39.0 - 52.0 %   MCV 90.9  78.0 - 100.0 fL   MCH 31.4  26.0 - 34.0 pg   MCHC 34.5  30.0 - 36.0 g/dL   RDW 12.8  11.5 - 15.5 %   Platelets 190  150 - 400 K/uL  COMPREHENSIVE METABOLIC PANEL     Status: Abnormal   Collection Time    04/29/14  4:49 PM      Result Value Ref Range    Sodium 146  137 - 147 mEq/L   Potassium 4.0  3.7 - 5.3 mEq/L   Chloride 107  96 - 112 mEq/L   CO2 24  19 - 32 mEq/L   Glucose, Bld 96  70 - 99 mg/dL   BUN 4 (*) 6 - 23 mg/dL   Creatinine, Ser 0.64  0.50 - 1.35 mg/dL   Calcium 8.5  8.4 - 10.5 mg/dL   Total Protein 8.0  6.0 - 8.3 g/dL   Albumin 4.3  3.5 - 5.2 g/dL   AST  85 (*) 0 - 37 U/L   ALT 75 (*) 0 - 53 U/L   Alkaline Phosphatase 78  39 - 117 U/L   Total Bilirubin 0.4  0.3 - 1.2 mg/dL   GFR calc non Af Amer >90  >90 mL/min   GFR calc Af Amer >90  >90 mL/min   Comment: (NOTE)     The eGFR has been calculated using the CKD EPI equation.     This calculation has not been validated in all clinical situations.     eGFR's persistently <90 mL/min signify possible Chronic Kidney     Disease.   Anion gap 15  5 - 15  ETHANOL     Status: Abnormal   Collection Time    04/29/14  4:49 PM      Result Value Ref Range   Alcohol, Ethyl (B) 380 (*) 0 - 11 mg/dL   Comment:            LOWEST DETECTABLE LIMIT FOR     SERUM ALCOHOL IS 11 mg/dL     FOR MEDICAL PURPOSES ONLY  SALICYLATE LEVEL     Status: Abnormal   Collection Time    04/29/14  4:49 PM      Result Value Ref Range   Salicylate Lvl <2.4 (*) 2.8 - 20.0 mg/dL  URINE RAPID DRUG SCREEN (HOSP PERFORMED)     Status: None   Collection Time    04/30/14 12:45 PM      Result Value Ref Range   Opiates NONE DETECTED  NONE DETECTED   Cocaine NONE DETECTED  NONE DETECTED   Benzodiazepines NONE DETECTED  NONE DETECTED   Amphetamines NONE DETECTED  NONE DETECTED   Tetrahydrocannabinol NONE DETECTED  NONE DETECTED   Barbiturates NONE DETECTED  NONE DETECTED   Comment:            DRUG SCREEN FOR MEDICAL PURPOSES     ONLY.  IF CONFIRMATION IS NEEDED     FOR ANY PURPOSE, NOTIFY LAB     WITHIN 5 DAYS.                LOWEST DETECTABLE LIMITS     FOR URINE DRUG SCREEN     Drug Class       Cutoff (ng/mL)     Amphetamine      1000     Barbiturate      200     Benzodiazepine   097      Tricyclics       353     Opiates          300     Cocaine          300     THC              50   Psychological Evaluations:  Assessment:   DSM5:  Substance/Addictive Disorders:  Alcohol Related Disorder - Severe (303.90), Alcohol withdrawal Depressive Disorders:  Major Depressive Disorder - Moderate (296.22)  AXIS I:  Alcohol Abuse, Substance Abuse and Substance Induced Mood Disorder AXIS II:  Deferred AXIS III:   Past Medical History  Diagnosis Date  . Liver cirrhosis    AXIS IV:  other psychosocial or environmental problems AXIS V:  51-60 moderate symptoms    Treatment Plan Summary: Daily contact with patient to assess and evaluate symptoms and progress in treatment Medication management Current Medications:  Current Facility-Administered Medications  Medication Dose Route Frequency Provider Last Rate Last Dose  .  acetaminophen (TYLENOL) tablet 650 mg  650 mg Oral Q6H PRN Benjamine Mola, FNP   650 mg at 05/01/14 1823  . alum & mag hydroxide-simeth (MAALOX/MYLANTA) 200-200-20 MG/5ML suspension 30 mL  30 mL Oral Q4H PRN Benjamine Mola, FNP      . hydrOXYzine (ATARAX/VISTARIL) tablet 25 mg  25 mg Oral Q6H PRN Benjamine Mola, FNP      . loperamide (IMODIUM) capsule 2-4 mg  2-4 mg Oral PRN Benjamine Mola, FNP      . LORazepam (ATIVAN) injection 0-4 mg  0-4 mg Intravenous 4 times per day Delfin Gant, NP       Followed by  . LORazepam (ATIVAN) injection 0-4 mg  0-4 mg Intravenous Q12H Delfin Gant, NP      . LORazepam (ATIVAN) tablet 1 mg  1 mg Oral Q6H PRN Benjamine Mola, FNP      . LORazepam (ATIVAN) tablet 1 mg  1 mg Oral TID Benjamine Mola, FNP   1 mg at 05/02/14 1218   Followed by  . [START ON 05/03/2014] LORazepam (ATIVAN) tablet 1 mg  1 mg Oral BID Benjamine Mola, FNP       Followed by  . [START ON 05/04/2014] LORazepam (ATIVAN) tablet 1 mg  1 mg Oral Daily John C Withrow, FNP      . magnesium hydroxide (MILK OF MAGNESIA) suspension 30 mL  30 mL Oral Daily PRN  Benjamine Mola, FNP      . multivitamin with minerals tablet 1 tablet  1 tablet Oral Daily Benjamine Mola, FNP   1 tablet at 05/02/14 [phone]  . ondansetron (ZOFRAN-ODT) disintegrating tablet 4 mg  4 mg Oral Q6H PRN Benjamine Mola, FNP   4 mg at 05/01/14 2016  . thiamine (VITAMIN B-1) tablet 100 mg  100 mg Oral Daily Benjamine Mola, FNP   100 mg at 05/02/14 8682   Plan: Discharge home with outpatient referrals as assisted by Tom with [facility], for psychiatry/counseling/substance abuse.    Benjamine Mola, FNP-BC 9/15/20151:18 PM   Personally evaluated the patient and agree with assessment and plan Geralyn Flash A. Sabra Heck, M.D.

## 2014-05-02 NOTE — Progress Notes (Signed)
Patient ID: Mark Bush, male   DOB: [birth year], 33 y.o.   MRN: [id number] Patient discharged to home with follow up services at [facility] the [place].  All discharge instructions reviewed with interpreter.  Patient verbalized understanding of all and voiced no further questions.  Left the unit ambulatory to wait for his ride in the lobby.

## 2014-05-02 NOTE — Discharge Instructions (Addendum)
°  To help you maintain a sober lifestyle, you may benefit from outpatient substance abuse treatment.  [facility] [place] provides this type of treatment for Spanish speakers.  Contact them at your earliest convenience to schedule an intake appointment:       [facility] [place]      [address]      [place], [place] [address]      [phone]      Contact one of the following clinicians:      Lesia Sago                   Ext. [address]     Ext. [phone]

## 2014-05-02 NOTE — [facility] Suicide Risk Assessment (Signed)
Suicide Risk Assessment  Discharge Assessment     Demographic Factors:  Male  Total Time spent with patient: 30 minutes  Psychiatric Specialty Exam:     Blood pressure 127/80, pulse 90, temperature 98.4 F (36.9 C), temperature source Oral, resp. rate 16, height  (1.702 m), weight 99.791 kg (220 lb), SpO2 100.00%.Body mass index is 34.45 kg/(m^2).  General Appearance: Disheveled  Eye Solicitor::  Fair  Speech:  Clear and Coherent  Volume:  Normal  Mood:  Euthymic  Affect:  Appropriate  Thought Process:  Coherent and Goal Directed  Orientation:  Full (Time, Place, and Person)  Thought Content:  symptoms, readiness for D/C , Relapse prevention plan  Suicidal Thoughts:  No  Homicidal Thoughts:  No  Memory:  Immediate;   Fair Recent;   Fair Remote;   Fair  Judgement:  Fair  Insight:  Present  Psychomotor Activity:  Normal  Concentration:  Fair  Recall:  [employer] of Knowledge:NA  Language: Fair  Akathisia:  No  Handed:    AIMS (if indicated):     Assets:  Desire for Improvement Housing Social Support Vocational/Educational  Sleep:       Musculoskeletal: Strength & Muscle Tone: within normal limits Gait & Station: normal Patient leans: N/A   Mental Status Per Nursing Assessment::   On Admission:     Current Mental Status by Physician: IN full contact with reality. No active S/S of withdrawal. Willing and motivated to pursue long term abstinence   Loss Factors: NA  Historical Factors: NA  Risk Reduction Factors:   Responsible for children under 32 years of age, Sense of responsibility to family, Living with another person, especially a relative and Positive social support  Continued Clinical Symptoms:  Alcohol/Substance Abuse/Dependencies  Cognitive Features That Contribute To Risk: None identified     Suicide Risk:  Minimal: No identifiable suicidal ideation.  Patients presenting with no risk factors but with morbid ruminations; may be  classified as minimal risk based on the severity of the depressive symptoms  Discharge Diagnoses:   AXIS I:  Alcohol Dependence, S/P alcohol withdrawal AXIS II:  No diagnosis AXIS III:   Past Medical History  Diagnosis Date  . Liver cirrhosis    AXIS IV:  other psychosocial or environmental problems AXIS V:  61-70 mild symptoms  Plan Of Care/Follow-up recommendations:  Activity:  as tolerated Diet:  regular Follow up outpatient treatment Is patient on multiple antipsychotic therapies at discharge:  No   Has Patient had three or more failed trials of antipsychotic monotherapy by history:  No  Recommended Plan for Multiple Antipsychotic Therapies: NA    Priyah Schmuck A 05/02/2014, 11:42 AM

## 2015-03-04 ENCOUNTER — Emergency Department (HOSPITAL_COMMUNITY): Payer: Self-pay

## 2015-03-04 ENCOUNTER — Encounter (HOSPITAL_COMMUNITY): Payer: Self-pay

## 2015-03-04 ENCOUNTER — Emergency Department (HOSPITAL_COMMUNITY)
Admission: EM | Admit: 2015-03-04 | Discharge: 2015-03-04 | Disposition: A | Discharge status: Home / Self Care | Payer: Self-pay | Attending: Emergency Medicine | Admitting: Emergency Medicine

## 2015-03-04 DIAGNOSIS — B360 Pityriasis versicolor: Secondary | ICD-10-CM

## 2015-03-04 DIAGNOSIS — M79672 Pain in left foot: Secondary | ICD-10-CM

## 2015-03-04 DIAGNOSIS — Z8719 Personal history of other diseases of the digestive system: Secondary | ICD-10-CM | POA: Insufficient documentation

## 2015-03-04 MED ORDER — TERBINAFINE HCL 1 % EX CREA: 1.0000 "application " | TOPICAL_CREAM | Freq: Two times a day (BID) | CUTANEOUS | Status: AC

## 2015-03-04 MED ORDER — SULFAMETHOXAZOLE-TRIMETHOPRIM 800-160 MG PO TABS: 1.0000 | ORAL_TABLET | Freq: Two times a day (BID) | ORAL | Status: AC

## 2015-03-04 MED ORDER — IBUPROFEN 400 MG PO TABS
800.0000 mg | ORAL_TABLET | Freq: Once | ORAL | Status: AC
  Administered 2015-03-04: 800 mg via ORAL
  Filled 2015-03-04: qty 2

## 2015-03-04 MED ORDER — SULFAMETHOXAZOLE-TRIMETHOPRIM 800-160 MG PO TABS
1.0000 | ORAL_TABLET | Freq: Once | ORAL | Status: AC
  Administered 2015-03-04: 1 via ORAL
  Filled 2015-03-04: qty 1

## 2015-03-04 NOTE — ED Provider Notes (Signed)
CSN: [id number]     Arrival date & time 03/04/15  1750 History  This chart was scribed for non-physician practitioner, Catha GosselinHanna Patel-Mills, PA-C, working with Richardean Canalavid H Yao, MD, by Ronney LionSuzanne Le, ED Scribe. This patient was seen in room TR10C/TR10C and the patient's care was started at 6:21 PM.      Chief Complaint  Patient presents with  . Foot Pain  . Rash   The history is provided by the patient. No language interpreter was used.   HPI Comments: Porfirio MylarJuan Garcia-Salazar is a 34 y.o. male who presents to the Emergency Department complaining of constant, 10/10, aching pain to the ball of his left foot that began 3 months ago and has been worsening since. He states he has not been able to work recently since the pain onset, as pushing on the bottom of his foot exacerbates his pain to a severe degree.   Patient also complains of a pruritic flat splotchy rash over his trunk area . He had been seen for this and prescribed a topical medication, which was ineffective the first time he used it. He then used it a second time with significant improvement, but the rash returned.  Patient denies any fever, chills. PCP: [facility]   Past Medical History  Diagnosis Date  . Liver cirrhosis    History reviewed. No pertinent past surgical history. History reviewed. No pertinent family history. History  Substance Use Topics  . Smoking status: Never Smoker   . Smokeless tobacco: Not on file  . Alcohol Use: 14.4 oz/week    24 Cans of beer per week     Comment: 24 packs    Review of Systems  Musculoskeletal: Positive for myalgias.  Skin: Positive for rash.    Allergies  Review of patient's allergies indicates no known allergies.  Home Medications   Prior to Admission medications   Medication Sig Start Date End Date Taking? Authorizing Provider  sulfamethoxazole-trimethoprim (BACTRIM DS,SEPTRA DS) 800-160 MG per tablet Take 1 tablet by mouth 2 (two) times daily. 03/04/15 03/11/15  Nehal Shives Patel-Mills, PA-C   terbinafine (LAMISIL AT) 1 % cream Apply 1 application topically 2 (two) times daily. 03/04/15   Fareed Fung Patel-Mills, PA-C   BP 163/83 mmHg  Pulse 94  Temp(Src) 98.2 F (36.8 C) (Oral)  Resp 14  SpO2 98% Physical Exam  Constitutional: He is oriented to person, place, and time. He appears well-developed and well-nourished. No distress.  HENT:  Head: Normocephalic and atraumatic.  Eyes: Conjunctivae and EOM are normal.  Neck: Neck supple. No tracheal deviation present.  Cardiovascular: Normal rate.   Pulmonary/Chest: Effort normal. No respiratory distress.  Musculoskeletal: Normal range of motion.  Good DP pulses. Able to flex and extend toes. Tenderness to palpation to plantar surface along the distal first and second metatarsal without fluctuance. Mild erythema and edema.  Neurological: He is alert and oriented to person, place, and time.  Skin: Skin is warm and dry.  Hyper pigmented areas on the chest and back that are flat with the surface of the normal skin.  Psychiatric: He has a normal mood and affect. His behavior is normal.  Nursing note and vitals reviewed.   ED Course  Procedures (including critical care time)  DIAGNOSTIC STUDIES: Oxygen Saturation is 98% on RA, normal by my interpretation.    COORDINATION OF CARE: 6:23 PM - Discussed treatment plan with pt at bedside, and pt agreed to plan.  Dg Foot Complete Left  03/04/2015   CLINICAL DATA:  LEFT foot pain  with pressure at ball of foot  EXAM: LEFT FOOT - COMPLETE 3+ VIEW  COMPARISON:  None  FINDINGS: Osseous mineralization normal.  Joint spaces preserved.  No fracture, dislocation, or bone destruction.  IMPRESSION: Normal exam.   Electronically Signed   By: Ulyses Southward M.D.   On: 03/04/2015 19:00      MDM   Final diagnoses:  Foot pain, left  Tinea versicolor  I used the nurse as a Nurse, learning disability. Patient has had this rash in the past and it resolved with cream but has since returned. The patient has a rash consistent  of tinea versicolor on the chest and back. I prescribed him anti-fungal cream. I discussed that he would need to see a physician in order to be prescribed pills for this since liver function tests would need to be checked regularly. He also has an infection at the bottom of the left foot but x-ray was negative for osteomyelitis. This does not appear to be consistent with gout since it is tender with deep palpation. I referred the patient to podiatry as well as [place] and wellness. I placed the patient on Bactrim for infection. Patient verbally agrees with the plan. I personally performed the services described in this documentation, which was scribed in my presence. The recorded information has been reviewed and is accurate.     Catha Gosselin, PA-C 03/04/15 2151  Richardean Canal, MD 03/04/15 2300

## 2015-03-04 NOTE — ED Notes (Signed)
Pt also has flat splotchy rash over trunk, itching.

## 2015-03-04 NOTE — Discharge Instructions (Signed)
Tia versicolor (Tinea Versicolor) Le han diagnosticado que usted padece tia versicolor. Se trata de una infeccin de la piel bastante frecuente, provocada por hongos. Los hongos son habitantes normales de [place]estra piel, pero el trastorno comienza cuando se desarrollan en forma desmedida. Se presenta como manchas de color blanco o marrn claro en la piel marrn, que son ms evidentes en el verano, sobre la piel bronceada. Estas reas son ligeramente escamosas si se las rasca. Las manchas ms claras producidas por el hongo se hacen ms evidentes cuando ocasiona "agujeros en el bronceado". Se advierte con ms frecuencia en el verano. Las manchas generalmente estn ubicados en el trax, el pubis, el cuello y los pliegues corporales. Sin embargo, esto puede ocurrir en cualquier rea del cuerpo. Puede haber picazn e inflamacin (enrojecimiento e irritacin) leve. DIAGNSTICO Este diagnstico (conocer el problema) se realiza clnicamente (con el examen visual). En general no hace falta realizar cultivos para el diagnstico. El examen en el microscopio puede ser de Bushutilidad. Sin embargo, los hongos se Games developerencuentran normalmente sobre la piel, de modo que el diagnstico sigue siendo clnico. El examen bajo la luz ultravioleta de [place] puede determinar la extensin de la infeccin. TRATAMIENTO Esta infeccin frecuente generalmente trae slo una preocupacin cosmtica (relacionada con la apariencia). En general, puede tratarse fcilmente con champ anticaspa en el momento de la ducha o el bao. Un restregado vigoroso generalmente Scientific laboratory technicianeliminar el hongo durante varios das. Las zonas claras de la piel pueden Personal assistantpermanecer as durante semanas o meses despus que la infeccion se haya curado, excepto que exponga la piel a la IT consultantluz solar. Las manchas ms claras o ms oscuras causadas por hongos, que permanecen luego de Mohawk Industriescompletarse el tratamiento, no son signo de fracaso del mismo; puede llevar un largo tiempo hasta que desaparezcan. El  profesional que lo asiste podr recomendarle cierto nmero de preparaciones comerciales o medicamentos por boca si el tratamiento casero no Retail buyerle da resultado. Las recurrencias son frecuentes y podra ser necesario que tome medicamentos preventivos. Este trastorno de la piel no es demasiado contagioso y no requiere de pautas especficas para proteger a Theme park managerallegados y Graybar Electricmiembros de Market researcherla familia. Generalmente lo ms adecuado es la higiene normal. Slo ser necesario hacer el seguimiento si presenta complicaciones como una infeccin secundaria debido al rascado, etc. o si el profesional que lo asiste se lo indica, o si no obtiene alivio de los UGI Corporationmedicamentos utilizados. Document Released: 05/14/2005 Document Revised: 10/27/2011 [facility] Patient Information 2015 [employer], [place]. This information is not intended to replace advice given to you by your health care provider. Make sure you discuss any questions you have with your health care provider.

## 2015-03-04 NOTE — ED Notes (Signed)
Pt reports onset 3 months what looks like callus on bottom of left foot.  Pt has not been able to work since d/t pain and when you push on bottom of foot it makes him want to pass out.

## 2016-04-16 IMAGING — DX DG FOOT COMPLETE 3+V*L*
3 series · 3 of 3 positions shown · non-contrast
Comparison: None

CLINICAL DATA: LEFT foot pain with pressure at ball of foot

EXAM:
LEFT FOOT - COMPLETE 3+ VIEW

[foot ap]
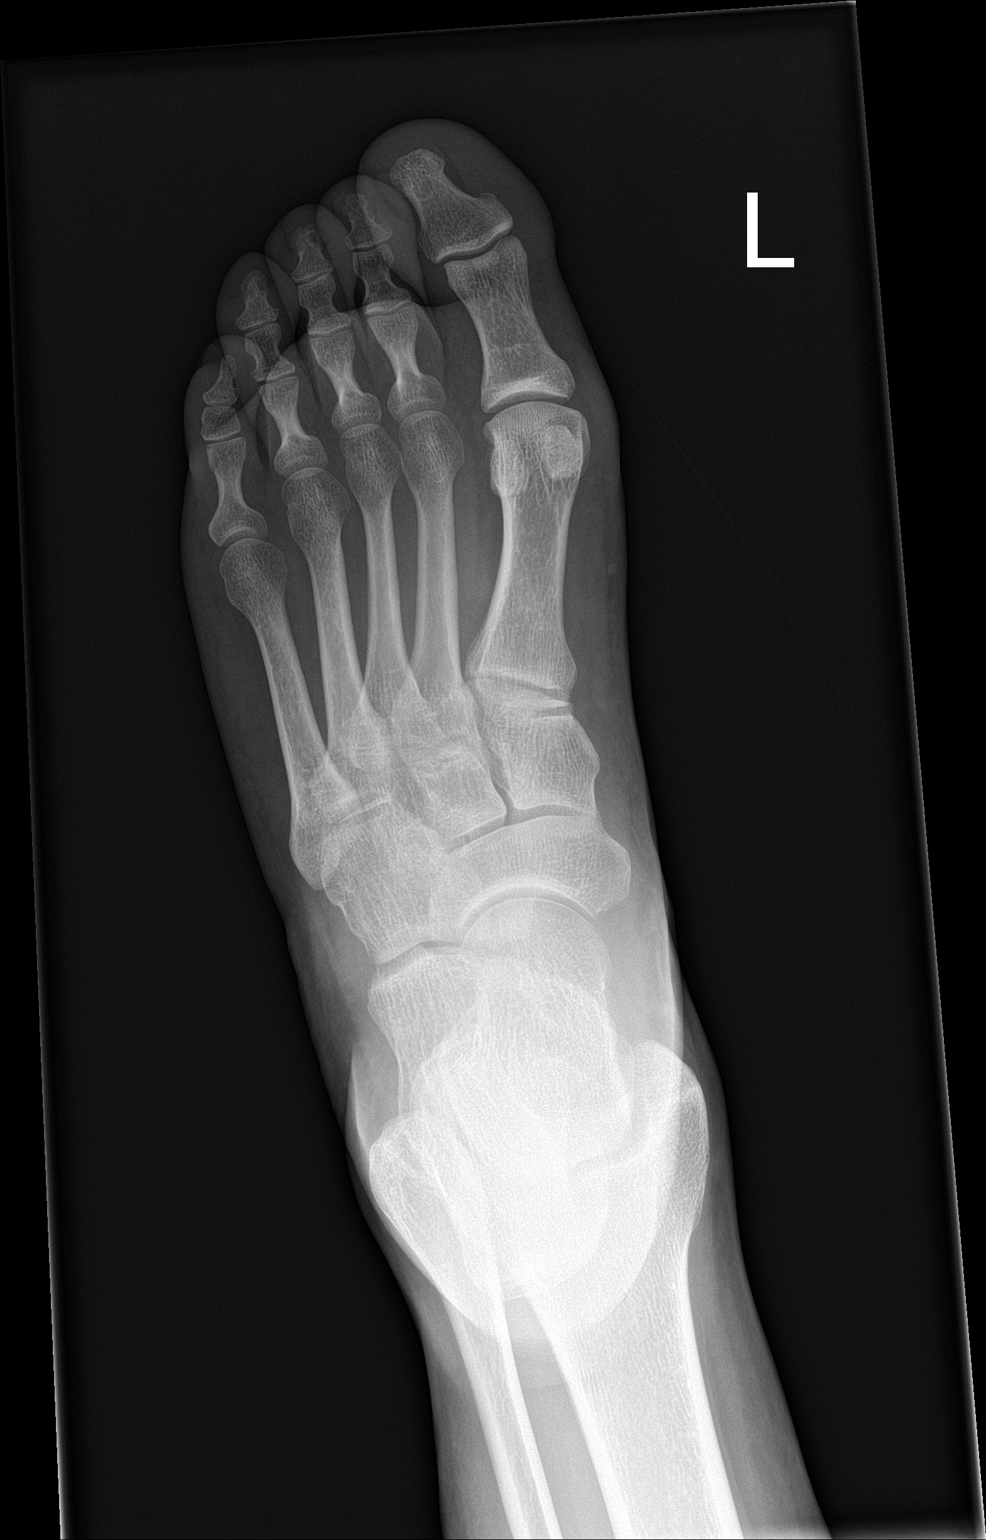

[foot obl]
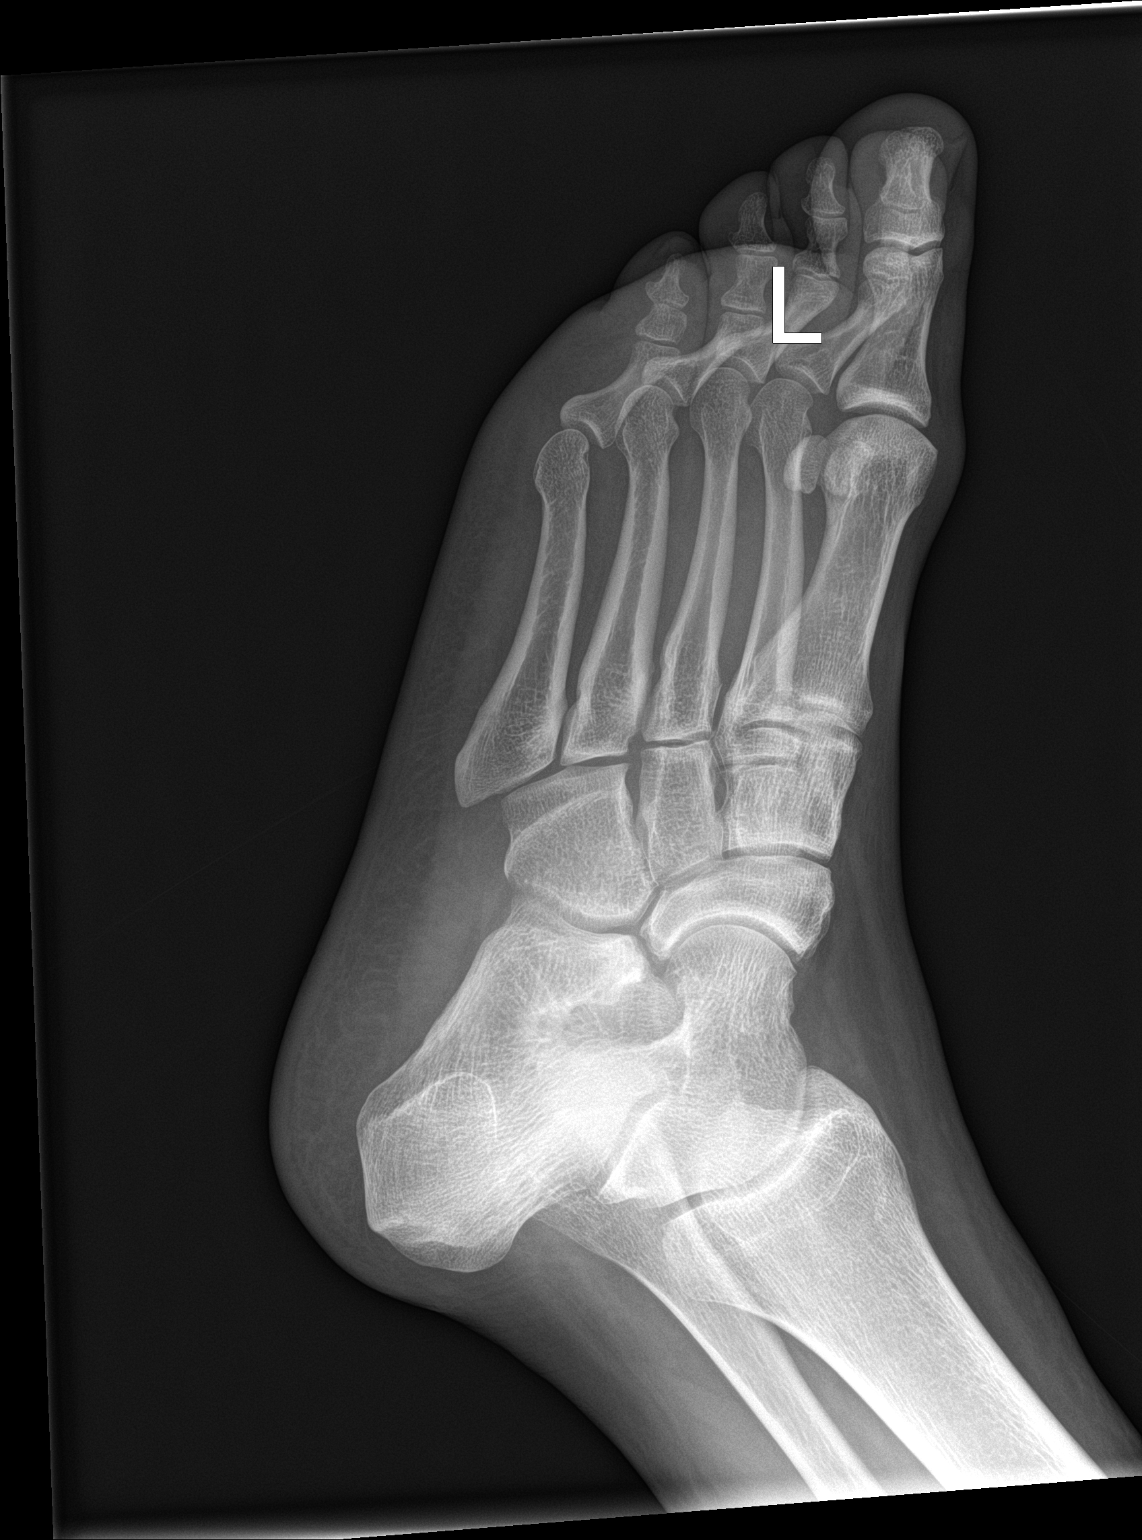

[foot lat]
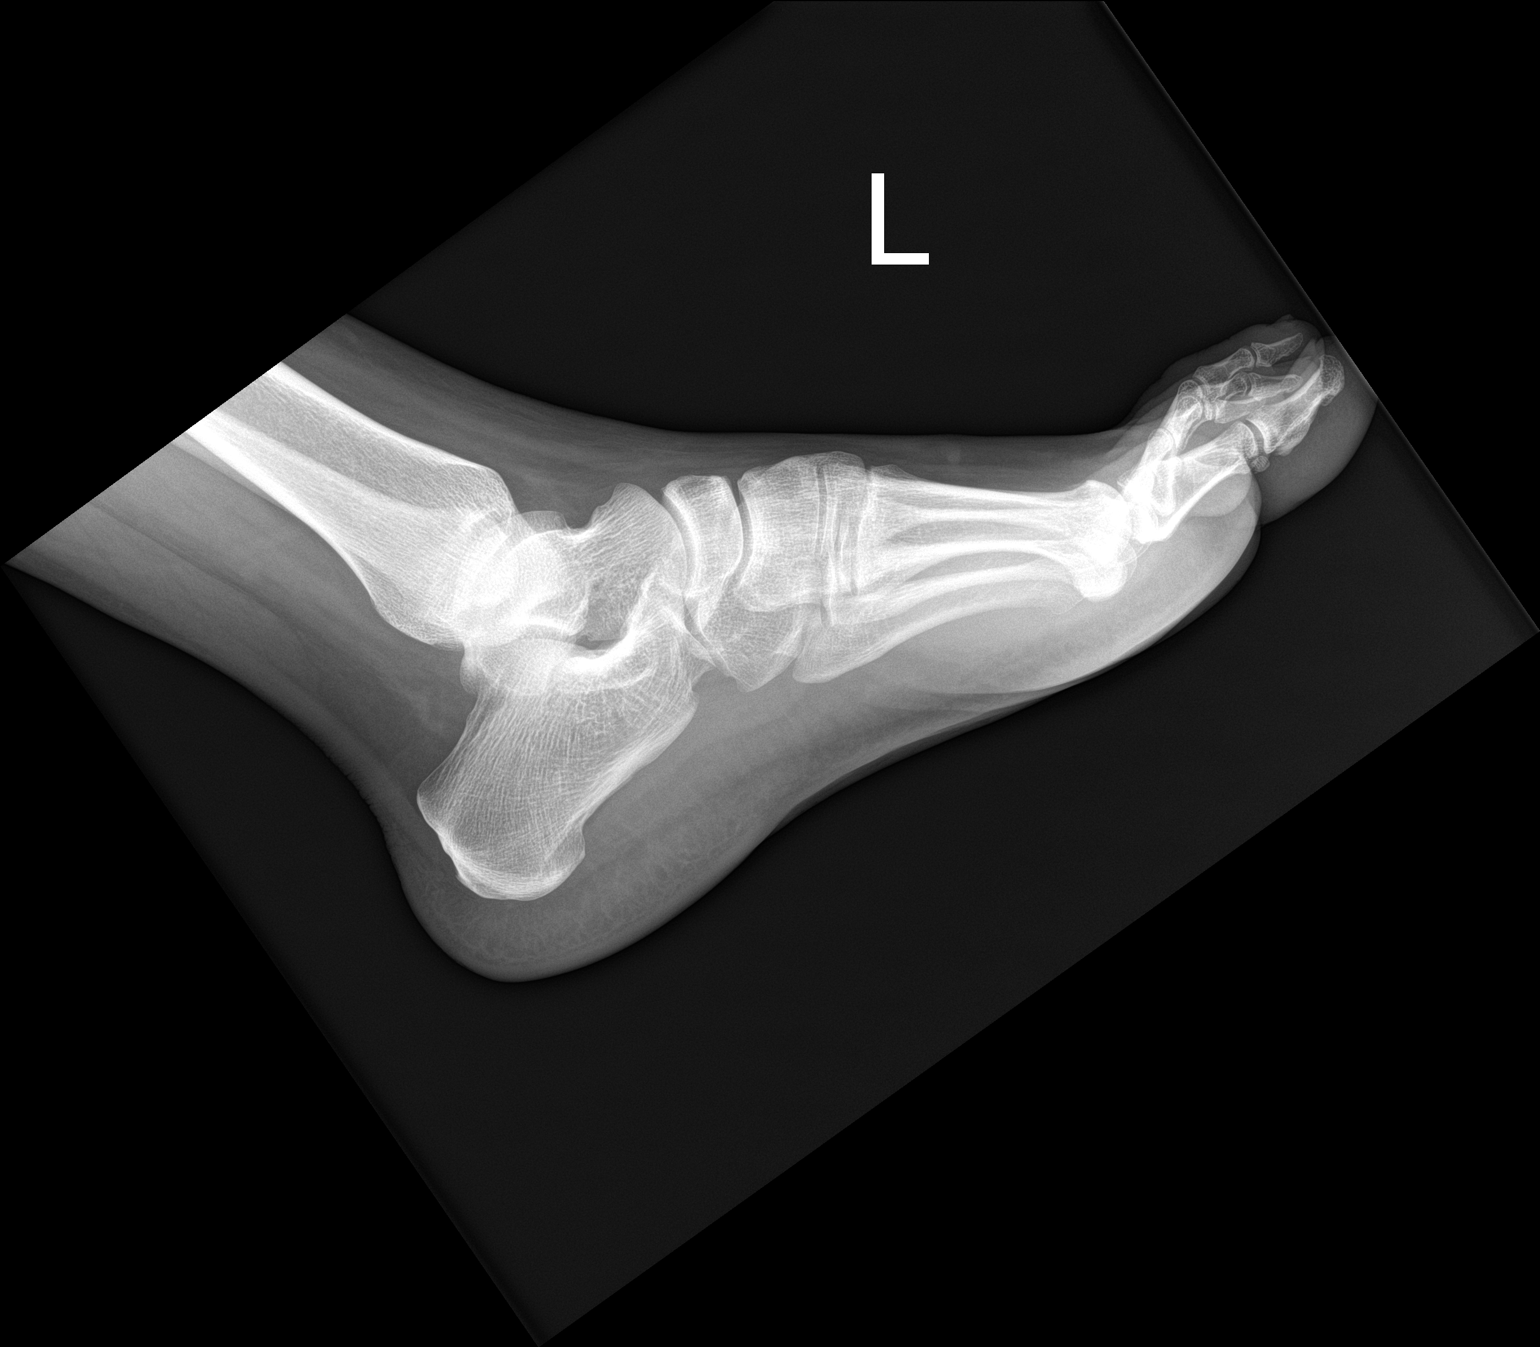

[3 of 3 positions shown; findings below may reference images not displayed]

FINDINGS: Osseous mineralization normal.

Joint spaces preserved.

No fracture, dislocation, or bone destruction.
IMPRESSION: Normal exam.
